# Patient Record
Sex: Female | Born: 1980 | Race: White | Hispanic: No | Marital: Married | Smoking: Current every day smoker
Health system: Southern US, Community
[De-identification: ages and names within clinical notes are randomized; demographics above are authoritative.]

## PROBLEM LIST (undated history)

## (undated) DIAGNOSIS — I341 Nonrheumatic mitral (valve) prolapse: Secondary | ICD-10-CM

## (undated) DIAGNOSIS — J45909 Unspecified asthma, uncomplicated: Secondary | ICD-10-CM

## (undated) DIAGNOSIS — F3160 Bipolar disorder, current episode mixed, unspecified: Secondary | ICD-10-CM

## (undated) DIAGNOSIS — E119 Type 2 diabetes mellitus without complications: Secondary | ICD-10-CM

---
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
## 2009-09-17 ENCOUNTER — Emergency Department: Payer: Self-pay | Admitting: Emergency Medicine

## 2010-04-29 ENCOUNTER — Encounter: Payer: Self-pay | Admitting: Obstetrics and Gynecology

## 2011-07-31 ENCOUNTER — Encounter: Payer: Self-pay | Admitting: Obstetrics and Gynecology

## 2011-08-21 ENCOUNTER — Encounter: Payer: Self-pay | Admitting: Maternal and Fetal Medicine

## 2011-10-17 ENCOUNTER — Observation Stay: Payer: Self-pay

## 2011-10-17 LAB — URINALYSIS, COMPLETE
Bacteria: NONE SEEN
Bilirubin,UR: NEGATIVE
Blood: NEGATIVE
Glucose,UR: NEGATIVE mg/dL (ref 0–75)
Leukocyte Esterase: NEGATIVE
Nitrite: NEGATIVE
Ph: 7 (ref 4.5–8.0)
RBC,UR: NONE SEEN /HPF (ref 0–5)
Squamous Epithelial: 3
WBC UR: <1 /HPF (ref 0–5)

## 2011-10-19 LAB — URINE CULTURE

## 2011-10-23 ENCOUNTER — Encounter: Payer: Self-pay | Admitting: Maternal & Fetal Medicine

## 2011-11-20 ENCOUNTER — Observation Stay: Payer: Self-pay | Admitting: Obstetrics and Gynecology

## 2011-11-20 LAB — URINALYSIS, COMPLETE
Bilirubin,UR: NEGATIVE
Blood: NEGATIVE
Ketone: NEGATIVE
Leukocyte Esterase: NEGATIVE
Ph: 7 (ref 4.5–8.0)
Protein: NEGATIVE
RBC,UR: <1 /HPF (ref 0–5)
Specific Gravity: 1.001 (ref 1.003–1.030)
WBC UR: 1 /HPF (ref 0–5)

## 2011-11-24 ENCOUNTER — Encounter: Payer: Self-pay | Admitting: Maternal & Fetal Medicine

## 2011-12-22 ENCOUNTER — Inpatient Hospital Stay: Payer: Self-pay

## 2011-12-22 HISTORY — PX: TUBAL LIGATION: SHX77

## 2011-12-22 LAB — CBC WITH DIFFERENTIAL/PLATELET
Basophil #: 0 10*3/uL (ref 0.0–0.1)
Eosinophil %: 2.3 %
HCT: 35.6 % (ref 35.0–47.0)
HGB: 12.5 g/dL (ref 12.0–16.0)
Lymphocyte #: 2.7 10*3/uL (ref 1.0–3.6)
Lymphocyte %: 19.3 %
MCH: 32.9 pg (ref 26.0–34.0)
Monocyte %: 5.8 %
Neutrophil %: 72.4 %
RDW: 13.4 % (ref 11.5–14.5)
WBC: 13.7 10*3/uL — ABNORMAL HIGH (ref 3.6–11.0)

## 2011-12-23 LAB — HEMATOCRIT: HCT: 28.4 % — ABNORMAL LOW (ref 35.0–47.0)

## 2011-12-24 LAB — PATHOLOGY REPORT

## 2014-02-16 ENCOUNTER — Ambulatory Visit: Payer: Self-pay

## 2014-02-16 ENCOUNTER — Emergency Department: Payer: Self-pay | Admitting: Emergency Medicine

## 2014-02-16 LAB — COMPREHENSIVE METABOLIC PANEL
ALBUMIN: 3.8 g/dL (ref 3.4–5.0)
ALT: 22 U/L
ANION GAP: 7 (ref 7–16)
Alkaline Phosphatase: 97 U/L
BUN: 10 mg/dL (ref 7–18)
Bilirubin,Total: 0.2 mg/dL (ref 0.2–1.0)
CALCIUM: 8.6 mg/dL (ref 8.5–10.1)
CHLORIDE: 106 mmol/L (ref 98–107)
CO2: 27 mmol/L (ref 21–32)
Creatinine: 0.74 mg/dL (ref 0.60–1.30)
EGFR (African American): >60
EGFR (Non-African Amer.): >60
Glucose: 89 mg/dL (ref 65–99)
Osmolality: 278 (ref 275–301)
POTASSIUM: 3.8 mmol/L (ref 3.5–5.1)
SGOT(AST): 19 U/L (ref 15–37)
Sodium: 140 mmol/L (ref 136–145)
Total Protein: 8 g/dL (ref 6.4–8.2)

## 2014-02-16 LAB — CBC
HCT: 40 % (ref 35.0–47.0)
HGB: 13.7 g/dL (ref 12.0–16.0)
MCH: 32.1 pg (ref 26.0–34.0)
MCHC: 34.2 g/dL (ref 32.0–36.0)
MCV: 94 fL (ref 80–100)
Platelet: 278 10*3/uL (ref 150–440)
RBC: 4.25 10*6/uL (ref 3.80–5.20)
RDW: 12.6 % (ref 11.5–14.5)
WBC: 11.8 10*3/uL — ABNORMAL HIGH (ref 3.6–11.0)

## 2014-02-16 LAB — URINALYSIS, COMPLETE
BACTERIA: NONE SEEN
BILIRUBIN, UR: NEGATIVE
BLOOD: NEGATIVE
GLUCOSE, UR: NEGATIVE mg/dL (ref 0–75)
Ketone: NEGATIVE
LEUKOCYTE ESTERASE: NEGATIVE
NITRITE: NEGATIVE
PROTEIN: NEGATIVE
Ph: 5 (ref 4.5–8.0)
Specific Gravity: 1.019 (ref 1.003–1.030)

## 2014-02-16 LAB — CK TOTAL AND CKMB (NOT AT ARMC): CK, Total: 67 U/L

## 2014-02-16 LAB — D-DIMER(ARMC): D-DIMER: 445 ng/mL

## 2014-02-16 LAB — TROPONIN I

## 2014-11-05 NOTE — Consult Note (Signed)
Referral Information:   Reason for Referral History of preterm delivery x3 (used procardia), prior child with spina bifida and hydrocephalus, prior cesareans    Referring Physician [facility]- Dr Sherilyn Banker    Prenatal Hx 34 yo G5 P1304 MWF with LMP 03/27/2011 at 15 1/7 by ultrasound with revised  EDC of 01/21/12,  4 cesarean sections, 3 preterm deliveries, 2nd child h/o spina bifida /hydrocephalus (was with another partner), h/o mild asthma, h/o bipolar disorder off medications    Past Obstetrical Hx 2002 - cesarean 34-36 weeks breech "Deonna"  female ,NICU x 3weeks for apnea and reflux helathy now 2005- c/s at [facility]  known spina bifida "Darryl" no shunt walks with braces, wears diapers, urinary reflux may need further surgery- I did not ask if on depakote at the time  2007- 38 weeks female "Perlie Gold" [place] infected incision  2012 36 weeks "McKenzie" discharged to home with her, [place] - some problems with anesthesia wearing off , no mention of ovary problems , took down scar tissue used procardia from 24 weeks to 36 weeks tried to take her off and she woudl contract , got Betamethasone for lungs   Home Medications:  Prenatal Multivitamins oral tablet: 1 tab(s) orally once a day, Active  Allergies:   Abilify: Anaphylaxis  Topamax: Rash  Vital Signs/Notes:  Nursing Vital Signs: **Vital Signs.:   17-Jan-13 09:01   Vital Signs Type Routine   Temperature Temperature (F) 98.3   Celsius 36.8   Temperature Source oral   Pulse Pulse 74   Pulse source per Dinamap   Respirations Respirations 14   Systolic BP Systolic BP 112   Diastolic BP (mmHg) Diastolic BP (mmHg) 556   Mean BP 408   BP Source Dinamap   Perinatal Consult:   LMP 27-Mar-2011    PMed Hx Rubella Immune, Hx of varicella    Past Medical History cont'd -Asthma - mild rarely uses inhaler- avoids aspirin uses alleve  -Smoker down to 2-3 cigs per day -Allergies - worse with new puppy, congestion ,  thinks flu shot made it worse -Bipolar dx age[age] , required hospitalizations, "cardiac arrest" while on lithium, depakote, topamax and abilify (now listed as "allergy")  off meds for 3 years (since with current husband), some moodiness after last delivery , husband confirms mood stable  - hypothyroidism age 72 related to psych meds? 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    Soc Hx married, tobacco, cut back on cigarettes   Review Of Systems:   Subjective Notes h/o left sided discomfort - thought it was a UTI but C&S negative - now wonders if it was the cyst Congestion    Medications/Allergies Reviewed Medications/Allergies reviewed     Additional Lab/Radiology Notes see ultrasound report - 15 weeks , 9 cm mulitloculated ov cyst   Impression/Recommendations:   Impression -IUP 15 weeks 1 9cm mulitloculated ovarian cyst - I reviewed that size and septations/loculations put it in a higher risk category for neoplasm including malignancy  2 cesarean x4 - plans repeat and BTL h/o wound breakdown, h/o scarring ,  h/o anesthesia "wearing off" 3 h/o open spina bifida - recurrence in 2-3 % range different father - decliend gen counseling, ultrasound today looks normal will reassess in 2 weeks - depakote related? (I did not ask)  4 h/o late preterm birth x3 34-36 weeks little morbidity from prematurity reviewed 6417 P, controlled with procardia in last pregnancy- t least a 50% risk of recurrence 5 h/o bipolar disorder off meds no mental health provider mood stable by report 6 asthma /allergies - mild sxs- could offer flonase  7-Desires sterilization     Recommendations 1- follow up ultrasound in 2 weeks if ovarian mass persists or enlarges,  I recommend removal prior to 22 weeks to avoid preterm labor of a viable fetus. If [facility] g[facility] w[facility] prefer I send our pregnant pts with complex adnexal masses to Dr Woodfin Ganjaraig [facility] for attempt at Laporoscopic removal - this cyst is high and to the left, should be accessible . I discussed torsion and need to go to ER for severe pain. Cyst was not present at cesarean one year ago. 2-will re-evaluate fetal spine in 2 weeks - given 15 weeks I did not discuss additional  folic acid  3 will re-assess cervical length in 2 weeks though sounds like preterm labor not cervical incompetence. Patient will read on 17 P but likely will refuse given how healthy her children have been despite being late preterm. I toldher procardia could be used if needed for contractions 4- pt declined referral to mental health 5 encouraged minimizing tobacco use   Plan:   Genetic Counseling declined    Prenatal Diagnosis Options Level II US, in 2 weeks    Ultrasound at what gestational ages 2730-32 weeks growth and assess anterior placenta    Delivery Mode Cesarean, BTL    Delivery at what gestational age [redacted] weeks     Total Time Spent with Patient 30 minutes    >50% of visit spent in couseling/coordination of care yes    Office Use Only [address]  Level 2 (30min) NEW office consult exp prob focused   Coding Description: MATERNAL CONDITIONS/HISTORY INDICATION(S).   Previous C-section.   Threatened premature labor > 22 weeks, < 37 weeks.   Tobacco use complicating pregnancy.  Electronic Signatures: Rondall AllegraLivingston, Laritza Vokes Gresham (MD)  (Signed 17-Jan-13 12:01)  Authored: Referral, Home Medications, Allergies, Vital Signs/Notes, Consult, Exam, Lab/Radiology Notes, Impression, Plan, Billing, Coding Description   Last Updated: 17-Jan-13 12:01 by Rondall AllegraLivingston, Reygan Heagle Gresham (MD)

## 2014-11-05 NOTE — Op Note (Signed)
PATIENT NAME:  Cynthia Howard, Cynthia Howard MR#:  [id number] DATE OF BIRTH:  1980-12-07  DATE OF PROCEDURE:  12/22/2011  PREOPERATIVE DIAGNOSIS: 35.3 week intrauterine pregnancy, preterm labor, prior C-section x4.   POSTOPERATIVE DIAGNOSIS: 35.3 week intrauterine pregnancy, preterm labor, prior C-section x4.   PROCEDURES: Repeat low transverse cesarean section with partial bilateral salpingectomy.   SURGEON: Cynthia Howard, M.D.   ASSISTANT: Cynthia Howard, Cynthia Howard  ESTIMATED BLOOD LOSS: 800 mL.  OPERATIVE FLUIDS: 1600 mL.   ANESTHESIA: Spinal.   COMPLICATIONS: None.   FINDINGS: Vertex female infant, 3280 grams, Apgars 8 and 9.   FINDINGS: Dense adhesions of lower uterine segment to the anterior abdominal wall, adhesions of the right tubal fimbria to the right ovary making tube difficult to locate during tubal, dense adhesions of bladder to lower uterine segment making bladder flap impossible to make.   SPECIMEN: Portion of right and left tube.   INDICATIONS: The patient is a 34 year old G5, P4 with prior history of four cesarean sections who presents with contractions every two minutes with no change in contractions after administration of terbutaline as well as short-term fetal tachycardia. Given the patient's presentation with contractions and no change after two hours of observation and pain with the contractions, the decision was made to proceed towards cesarean section for delivery given her prior history for four cesarean sections. The risks, benefits, indications, and alternatives of the procedure were explained and informed consent was obtained.   DESCRIPTION OF PROCEDURE: The patient was taken to the Operating Room with IV fluids running. She was prepped and draped in the usual sterile fashion with a leftward tilt. A midline skin incision was made and carried down to THE underlying fascia at which time the abdominal cavity was entered. The incision was extended superiorly using curved Mayo  scissors and extended inferiorly in a blunt fashion. The uterus was identified and dense adhesions were noted of the anterior uterus to the anterior abdominal wall. The bladder flap was attempted to be created, however, due to dense adhesions in this area, this was not possible. Therefore, a higher than normal uterine transverse incision was made to ensure no damage to the bladder. The placenta was also anterior and had to be entered in order to grasp the infant's head. The infant's head was grasped and delivered atraumatically through the hysterotomy incision. The mouth and nose were bulb suctioned. The anterior and posterior shoulder were delivered followed by the remainder of the body. The cord was clamped x2 and cut and the infant was handed to the awaiting nursery staff. The placenta was expressed. The uterus was exteriorized with difficulty secondary to the previously mentioned dense adhesions. The uterus was cleared of all clot and debris. The hysterotomy incision was repaired in two layers. There was constant oozing from the bluntly lysed adhesions of the anterior abdominal wall and the lower uterine segment and the area of the serosa had been sheared at the time of lysis of adhesions and this was repaired using running locked stitches of #0 Monocryl suture. The hysterotomy incision had been repaired with #0 Monocryl on a CTX needle. The left tube was grasped with a Babcock. The knuckle of tube was tied using #0 plain gut x2, and also suture tied, and the tube loop was then cut. The cut edges were made hemostatic. This was repeated on the patient's right tube; however, this was done with great difficulty secondary to adhesions and difficulty identifying the left tube by following it out to the fimbriated end.  The patient was awake during the procedure and is aware that because of the difficulty of identifying the right tube that until the pathology report comes back it will not be known whether or not her  right tube was successfully tied and she understands that an alternative form of birth control may be needed. The uterus was returned to the abdomen. Arista as well as Surgicel hemostatic agents were used for the constant oozing. The fascia and peritoneum was closed in a Smead-Jones fashion using #1 double-stranded PDS. The subcutaneous tissue was        closed with 2-0 Vicryl and the skin was closed with staples. The patient tolerated the procedure well. Sponge, lap, needle and instrument counts were correct x2, and the patient was taken to the recovery room in stable condition.  ____________________________ Cynthia PrimesLashawn A. Patton Howard, Cynthia Howard law:slb D: 12/22/2011 22:39:28 ET T: 12/23/2011 09:45:34 ET JOB#: [id number]  cc: Cynthia MelterLashawn A. Patton Howard, Cynthia Howard, <Dictator> Cynthia ContesLASHAWN A WEAVER Howard Cynthia Howard ELECTRONICALLY SIGNED 12/25/2011 7:43

## 2014-11-21 NOTE — H&P (Signed)
L&D Evaluation:  History:   HPI 34 yo G5P4004 whose EDC = 01/21/12 (US at 15 weeks)  Pt followed at [facility] for this pregnancy.  Patient presents with uterine contractions and leakage of fluid x 2 days.  She states that she has been having contractions for the past several week, but today is worse.  She notes positive fetal movement, denies VB.  Pt's 4 previous pregnancies terminated via LTCS (the last described severe adhesions between anterior uterus and anterior abdominal wall)  Pt jhad PTL with all her pregnancies, however, her smallest infant weighed 5#9.  Patient presented too late for 17-OHP for this pregnancy per [facility].  Patient's 2nd pregnancy complicated by infant with spina bifida.  Pt also desires PPTL (signed papers 4 5 13).  B+, RPR NR, RI, HBsAg neg, declined AFP. GBS unk.    Presents with contractions, leaking fluid    Patient's Medical History BiPolar for the last 5 years, mild asthma    Patient's Surgical History Previous C-Section  times 4    Medications Pre Natal Vitamins    Allergies Abilify, Topamax    Social History tobacco    Family History second child had spina bifida   ROS:   ROS All systems were reviewed.  HEENT, CNS, GI, GU, Respiratory, CV, Renal and Musculoskeletal systems were found to be normal., unless noted in HPI   Exam:   Vital Signs stable    Urine Protein negative dipstick    General no apparent distress    Mental Status clear    Chest clear    Heart normal sinus rhythm    Abdomen gravid, non-tender    Estimated Fetal Weight Average for gestational age    Back no CVAT    Edema no edema    Pelvic cervix closed and thick    Mebranes Intact    FHT normal rate with no decels    FHT Description 135/mod var/+accels/no decels    Ucx irregular, 0-1 q 10 minutes.    Other UA: spec grav = 1.001, pH = 7.0, nitr = neg, LE=neg, bact=trace, Epi's=1 Nitrazine=neg, Fern=neg, pool=neg   Impression:   Impression reactive NST, abdominal  pain   Plan:   Plan UA, EFM/NST, monitor contractions and for cervical change, False  Labor    Comments - no evidence of PPROM - Labor: no evidence of labor    Follow Up Appointment already scheduled. 4 days, [facility] planning   Electronic Signatures: Conard NovakJackson, Thurmond Hildebran D (MD)  (Signed 09-May-13 22:52)  Authored: L&D Evaluation   Last Updated: 09-May-13 22:52 by Conard NovakJackson, Skylinn Vialpando D (MD)

## 2014-11-21 NOTE — H&P (Signed)
L&D Evaluation:  History Expanded:   HPI 34 yo G5P4004 whose EDC = 01/21/12 (US at 15 weeks)  Pt followed at [facility] for this pregnancy.  Pt referred from [facility] for uterine contractions.  Pt's 4 previous pregnancies terminated via LTCS (the last described severe adhesions between anterior uterus and anterior abdominal wall)  Pt jhad PTL with all her pregnancies, however, her smallest infant weighed 5#9.  Pt also desires PPTL (signed papers 4 5 13)    Blood Type B positive    Maternal HIV Negative    Maternal Syphilis Ab Nonreactive    Rubella Results immune    Presents with contractions    Patient's Medical History BiPolar for the last 5 years, mild asthma    Patient's Surgical History Previous C-Section  times 4    Medications Pre Natal Vitamins    Allergies Abilify, Topamax    Social History tobacco    Family History second child had spina bifida   Exam:   Vital Signs stable    General no apparent distress    Mental Status clear    Chest clear    Heart normal sinus rhythm    Abdomen gravid, non-tender    Estimated Fetal Weight Average for gestational age    Pelvic cervix closed and thick    Mebranes Intact    FHT normal rate with no decels    Ucx irregular   Plan:   Plan False  Labor    Comments will hydrate, allow to gohome if contractions cease Pt also advised [facility].  I have advised this patient that we most likely will deliver her through a midline incision   Electronic Signatures: Towana Badgerosenow, Philip J (MD)  (Signed 05-Apr-13 16:07)  Authored: L&D Evaluation   Last Updated: 05-Apr-13 16:07 by Towana Badgerosenow, Philip J (MD)

## 2019-01-30 ENCOUNTER — Encounter: Payer: Self-pay | Admitting: Radiology

## 2019-01-30 ENCOUNTER — Emergency Department: Payer: Medicaid Other

## 2019-01-30 ENCOUNTER — Other Ambulatory Visit: Payer: Self-pay

## 2019-01-30 ENCOUNTER — Observation Stay
Admission: EM | Admit: 2019-01-30 | Discharge: 2019-01-31 | Disposition: A | Discharge status: Home / Self Care | Payer: Medicaid Other | Attending: Internal Medicine | Admitting: Internal Medicine

## 2019-01-30 DIAGNOSIS — R600 Localized edema: Secondary | ICD-10-CM

## 2019-01-30 DIAGNOSIS — R531 Weakness: Secondary | ICD-10-CM | POA: Diagnosis present

## 2019-01-30 DIAGNOSIS — F17210 Nicotine dependence, cigarettes, uncomplicated: Secondary | ICD-10-CM | POA: Insufficient documentation

## 2019-01-30 DIAGNOSIS — Z794 Long term (current) use of insulin: Secondary | ICD-10-CM | POA: Diagnosis not present

## 2019-01-30 DIAGNOSIS — F319 Bipolar disorder, unspecified: Secondary | ICD-10-CM | POA: Diagnosis not present

## 2019-01-30 DIAGNOSIS — K5730 Diverticulosis of large intestine without perforation or abscess without bleeding: Secondary | ICD-10-CM | POA: Insufficient documentation

## 2019-01-30 DIAGNOSIS — Z1159 Encounter for screening for other viral diseases: Secondary | ICD-10-CM | POA: Insufficient documentation

## 2019-01-30 DIAGNOSIS — I639 Cerebral infarction, unspecified: Principal | ICD-10-CM

## 2019-01-30 DIAGNOSIS — Z79899 Other long term (current) drug therapy: Secondary | ICD-10-CM | POA: Insufficient documentation

## 2019-01-30 DIAGNOSIS — R2681 Unsteadiness on feet: Secondary | ICD-10-CM | POA: Diagnosis not present

## 2019-01-30 DIAGNOSIS — I341 Nonrheumatic mitral (valve) prolapse: Secondary | ICD-10-CM | POA: Diagnosis not present

## 2019-01-30 DIAGNOSIS — E119 Type 2 diabetes mellitus without complications: Secondary | ICD-10-CM | POA: Diagnosis not present

## 2019-01-30 DIAGNOSIS — J45909 Unspecified asthma, uncomplicated: Secondary | ICD-10-CM | POA: Insufficient documentation

## 2019-01-30 DIAGNOSIS — R2990 Unspecified symptoms and signs involving the nervous system: Secondary | ICD-10-CM | POA: Diagnosis present

## 2019-01-30 DIAGNOSIS — R200 Anesthesia of skin: Secondary | ICD-10-CM | POA: Diagnosis present

## 2019-01-30 HISTORY — DX: Unspecified asthma, uncomplicated: J45.909

## 2019-01-30 HISTORY — DX: Type 2 diabetes mellitus without complications: E11.9

## 2019-01-30 LAB — COMPREHENSIVE METABOLIC PANEL
ALT: 21 U/L (ref 0–44)
AST: 15 U/L (ref 15–41)
Albumin: 4 g/dL (ref 3.5–5.0)
Alkaline Phosphatase: 81 U/L (ref 38–126)
Anion gap: 8 (ref 5–15)
BUN: 12 mg/dL (ref 6–20)
CO2: 25 mmol/L (ref 22–32)
Calcium: 8.8 mg/dL — ABNORMAL LOW (ref 8.9–10.3)
Chloride: 105 mmol/L (ref 98–111)
Creatinine, Ser: 0.74 mg/dL (ref 0.44–1.00)
GFR calc Af Amer: >60 mL/min (ref >60)
GFR calc non Af Amer: >60 mL/min (ref >60)
Glucose, Bld: 113 mg/dL — ABNORMAL HIGH (ref 70–99)
Potassium: 3.7 mmol/L (ref 3.5–5.1)
Sodium: 138 mmol/L (ref 135–145)
Total Bilirubin: 0.2 mg/dL — ABNORMAL LOW (ref 0.3–1.2)
Total Protein: 7 g/dL (ref 6.5–8.1)

## 2019-01-30 LAB — GLUCOSE, CAPILLARY: Glucose-Capillary: 111 mg/dL — ABNORMAL HIGH (ref 70–99)

## 2019-01-30 LAB — APTT: aPTT: 29 seconds (ref 24–36)

## 2019-01-30 LAB — CBC
HCT: 37.6 % (ref 36.0–46.0)
Hemoglobin: 12.9 g/dL (ref 12.0–15.0)
MCH: 32 pg (ref 26.0–34.0)
MCHC: 34.3 g/dL (ref 30.0–36.0)
MCV: 93.3 fL (ref 80.0–100.0)
Platelets: 268 10*3/uL (ref 150–400)
RBC: 4.03 MIL/uL (ref 3.87–5.11)
RDW: 11.9 % (ref 11.5–15.5)
WBC: 11.7 10*3/uL — ABNORMAL HIGH (ref 4.0–10.5)
nRBC: 0 % (ref 0.0–0.2)

## 2019-01-30 LAB — DIFFERENTIAL
Abs Immature Granulocytes: 0.04 10*3/uL (ref 0.00–0.07)
Basophils Absolute: 0.1 10*3/uL (ref 0.0–0.1)
Basophils Relative: 1 %
Eosinophils Absolute: 0.5 10*3/uL (ref 0.0–0.5)
Eosinophils Relative: 4 %
Immature Granulocytes: 0 %
Lymphocytes Relative: 26 %
Lymphs Abs: 3 10*3/uL (ref 0.7–4.0)
Monocytes Absolute: 0.6 10*3/uL (ref 0.1–1.0)
Monocytes Relative: 5 %
Neutro Abs: 7.5 10*3/uL (ref 1.7–7.7)
Neutrophils Relative %: 64 %

## 2019-01-30 LAB — SARS CORONAVIRUS 2 BY RT PCR (HOSPITAL ORDER, PERFORMED IN ~~LOC~~ HOSPITAL LAB): SARS Coronavirus 2: NEGATIVE

## 2019-01-30 LAB — PROTIME-INR
INR: 1 (ref 0.8–1.2)
Prothrombin Time: 12.7 seconds (ref 11.4–15.2)

## 2019-01-30 LAB — POCT PREGNANCY, URINE: Preg Test, Ur: NEGATIVE

## 2019-01-30 MED ORDER — SODIUM CHLORIDE 0.9 % IV SOLN
50.0000 mL | Freq: Once | INTRAVENOUS | Status: AC
  Administered 2019-01-30: 50 mL via INTRAVENOUS

## 2019-01-30 MED ORDER — ALTEPLASE 100 MG IV SOLR
INTRAVENOUS | Status: AC
  Administered 2019-01-30: 20:00:00 90 mg via INTRAVENOUS
  Filled 2019-01-30: qty 100

## 2019-01-30 MED ORDER — LABETALOL HCL 5 MG/ML IV SOLN: 20.0000 mg | Freq: Once | INTRAVENOUS | Status: DC

## 2019-01-30 MED ORDER — ASPIRIN EC 81 MG PO TBEC: 81.0000 mg | DELAYED_RELEASE_TABLET | Freq: Every day | ORAL | Status: DC

## 2019-01-30 MED ORDER — IOHEXOL 350 MG/ML SOLN
125.0000 mL | Freq: Once | INTRAVENOUS | Status: AC | PRN
  Administered 2019-01-30: 125 mL via INTRAVENOUS

## 2019-01-30 MED ORDER — SODIUM CHLORIDE 0.9% FLUSH: 3.0000 mL | Freq: Once | INTRAVENOUS | Status: DC

## 2019-01-30 MED ORDER — CLEVIDIPINE BUTYRATE 0.5 MG/ML IV EMUL: 0.0000 mg/h | INTRAVENOUS | Status: DC

## 2019-01-30 MED ORDER — ALTEPLASE (STROKE) FULL DOSE INFUSION
90.0000 mg | Freq: Once | INTRAVENOUS | Status: AC
  Administered 2019-01-30: 90 mg via INTRAVENOUS
  Filled 2019-01-30 (×2): qty 100

## 2019-01-30 NOTE — ED Notes (Signed)
Pt transported to CT for CTA at this time.

## 2019-01-30 NOTE — Consult Note (Signed)
[facility] Tele[facility]urology Consult Services  Stat Consult  Date of Service:   01/30/2019 22:59:59  Impression:     .  transient weakness  Comments/Sign-Out: I think her care up to this point has been excellent and appropriate. However she does have some atypical features such as she gets significant pain in her left forearm when she gets the weakness, a MRI of the C-spine could be added to her workup to make sure she does not have a compressive myeloradiculopathy although she does have atypical features for that as well. I do not think we need a stat repeat CT of the head at this moment because her symptoms have completely resolved which is inconsistent with a cerebral hemorrhage. If she is having stuttering cerebrovascular ischemia, there is no more anti-thrombotic therapy that would be safe for her until 24 hours after she is received the TPA so she was not started on any new agents at this time. She will need continued neurology follow-up while in house. a nonorganic syndrome is not completely excluded either this point.  Metrics: [facility] Notification Time: 01/30/2019 22:59:17 Stamp Time: 01/30/2019 22:59:59 Video Start Time: 01/30/2019 23:11:32  Our recommendations are outlined below.   Disposition: Neurology Follow Up Recommended  ----------------------------------------------------------------------------------------------------  Chief Complaint: transient pain annd weakness  History of Present Illness: Patient is a 38 year old Female.  Today  she developed pain in her left arm and then had significant weakness of the left upper and left lower extremity she also had some chest pain. She came to the emergency department and had a CTA of the chest to rule out aortic dissection which was normal. She had a CT of the head and neck that was normal and she was given TPA. Shortly after receiving the TPA her symptoms completely resolved. At 2300 tonight she developed pain in  her left forearm again and then she felt like her left arm and left leg were weak again, this lasted for about 11 minutes. At this time she is completely back to normal. She has no history of cervical disease.  Examination: BP(141/74), 1A: Level of Consciousness - Alert; keenly responsive + 0 1B: Ask Month and Age - Both Questions Right + 0 1C: Blink Eyes & Squeeze Hands - Performs Both Tasks + 0 2: Test Horizontal Extraocular Movements - Normal + 0 3: Test Visual Fields - No Visual Loss + 0 4: Test Facial Palsy (Use Grimace if Obtunded) - Normal symmetry + 0 5A: Test Left Arm Motor Drift - No Drift for 10 Seconds + 0 5B: Test Right Arm Motor Drift - No Drift for 10 Seconds + 0 6A: Test Left Leg Motor Drift - No Drift for 5 Seconds + 0 6B: Test Right Leg Motor Drift - No Drift for 5 Seconds + 0 7: Test Limb Ataxia (FNF/Heel-Shin) - No Ataxia + 0 8: Test Sensation - Normal; No sensory loss + 0 9: Test Language/Aphasia - Normal; No aphasia + 0 10: Test Dysarthria - Normal + 0 11: Test Extinction/Inattention - No abnormality + 0  NIHSS Score: 0   Due to the immediate potential for life-threatening deterioration due to underlying acute neurologic illness, I spent 35 minutes providing critical care. This time includes time for face to face visit via telemedicine, review of medical records, imaging studies and discussion of findings with providers, the patient and/or family.   Dr Janean Sark   [facility] [phone]   Case [id number]

## 2019-01-30 NOTE — ED Notes (Signed)
Pt in ct with RN assist.

## 2019-01-30 NOTE — ED Notes (Addendum)
2313 On call neurologist on tele. Advised no additional blood thinners for 24 hours.

## 2019-01-30 NOTE — ED Provider Notes (Signed)
[facility] Emergency Department Provider Note  ____________________________________________   First MD Initiated Contact with Patient 01/30/19 1859     (approximate)  I have reviewed the triage vital signs and the nursing notes.   HISTORY  Chief Complaint Code Stroke    HPI Cynthia Howard is a 38 y.o. female with bipolar who presents for strokelike symptoms.  Per family patient was normal at dinnertime and then 1 to 2 hours ago developed difficulty with speech, facial asymmetry, numbness on the left side of her body.  Patient denies previously having stroke.  Denies blood thinners.  Patient denies blood thinner issues  Onset 1 to 2 hours ago, constant, nothing makes it better, nothing makes it worse.    Medical: Bipolar Surgical: None Social: Denies daily alcohol or drug use.  Allergies Abilify [aripiprazole] and Topamax [topiramate]  Family History  Problem Relation Age of Onset   Diabetes Father      Review of Systems Constitutional: No fever/chills Eyes: No visual changes. ENT: No sore throat.  Positive speech changes Cardiovascular: Denies chest pain. Respiratory: Denies shortness of breath. Gastrointestinal: No abdominal pain.  No nausea, no vomiting.  No diarrhea.  No constipation. Genitourinary: Negative for dysuria. Musculoskeletal: Negative for back pain. Skin: Negative for rash. Neurological: Positive sensation changes.  Positive difficulty was speech. All other ROS negative ____________________________________________   PHYSICAL EXAM:  VITAL SIGNS:   Constitutional: Alert and oriented. Well appearing and in no acute distress. Eyes: Conjunctivae are normal. EOMI. Head: Atraumatic. Nose: No congestion/rhinnorhea. Mouth/Throat: Mucous membranes are moist.   Neck: No stridor. Trachea Midline. FROM Cardiovascular: Normal rate, regular rhythm. Grossly normal heart sounds.  Good peripheral  circulation. Respiratory: Normal respiratory effort.  No retractions. Lungs CTAB. Gastrointestinal: Soft and nontender. No distention. No abdominal bruits.  Musculoskeletal: No lower extremity tenderness nor edema.  No joint effusions. Neurologic: Patient seems to have asymmetric face, difficulty speaking, reports decreased sensation on the left side of her body. NIHSS 8 Skin:  Skin is warm, dry and intact. No rash noted. Psychiatric: Mood and affect are normal. Speech and behavior are normal. GU: Deferred   ____________________________________________   LABS (all labs ordered are listed, but only abnormal results are displayed)  Labs Reviewed  PROTIME-INR  APTT  CBC  DIFFERENTIAL  COMPREHENSIVE METABOLIC PANEL  CBG MONITORING, ED  I-STAT CREATININE, ED  POC URINE PREG, ED   ____________________________________________   ED ECG REPORT I, Concha SeMary E Romond Pipkins, the attending physician, personally viewed and interpreted this ECG.  EKG normal sinus rate of 93, no ST elevation, no T wave inversion however does have multiple PVCs. ____________________________________________  RADIOLOGY Vela ProseI, Shontel Santee E Delwin Raczkowski, personally viewed and evaluated these images (plain radiographs) as part of my medical decision making, as well as reviewing the written report by the radiologist.    Official radiology report(s): Ct Code Stroke Cta Head W/wo Contrast  Result Date: 01/30/2019 CLINICAL DATA:  Left-sided numbness and weakness.  Slurred speech. EXAM: CT ANGIOGRAPHY HEAD AND NECK TECHNIQUE: Multidetector CT imaging of the head and neck was performed using the standard protocol during bolus administration of intravenous contrast. Multiplanar CT image reconstructions and MIPs were obtained to evaluate the vascular anatomy. Carotid stenosis measurements (when applicable) are obtained utilizing NASCET criteria, using the distal internal carotid diameter as the denominator. CONTRAST:  125mL OMNIPAQUE IOHEXOL 350  MG/ML SOLN COMPARISON:  None. FINDINGS: CTA NECK FINDINGS Aortic arch: Standard 3 vessel aortic arch with widely patent arch vessel origins. Right carotid  system: Patent and smooth without evidence of stenosis or dissection. Left carotid system: Patent and smooth without evidence of stenosis or dissection. Vertebral arteries: Patent and codominant without evidence of stenosis or dissection. Skeleton: Mild cervical spondylosis. Other neck: No evidence of cervical lymphadenopathy or mass. Upper chest: Reported separately. Review of the MIP images confirms the above findings CTA HEAD FINDINGS Anterior circulation: The internal carotid arteries are widely patent from skull base to carotid termini. ACAs and MCAs are patent without evidence of proximal branch occlusion or flow limiting M1 stenosis. The left A1 segment is hypoplastic. Diffusely irregular appearance of the small and medium-sized vessels is felt to be artifactual related to image noise and mildly suboptimal arterial opacification, however this does preclude assessment for branch vessel stenosis. No aneurysm is identified. Posterior circulation: The intracranial vertebral arteries are widely patent to the basilar. Patent right PICA, left AICA, and bilateral SCA origins are visualized. There is a fetal origin of the left PCA. Both PCAs are patent without evidence of significant proximal stenosis. No aneurysm is identified. Venous sinuses: Patent. Anatomic variants: Fetal left PCA. Review of the MIP images confirms the above findings IMPRESSION: 1. Widely patent cervical carotid and vertebral arteries. 2. No intracranial large vessel occlusion or flow limiting proximal stenosis. Limited assessment of the small to medium-sized vessels. Electronically Signed   By: Logan Bores M.D.   On: 01/30/2019 20:18   Ct Code Stroke Cta Neck W/wo Contrast  Result Date: 01/30/2019 CLINICAL DATA:  Left-sided numbness and weakness.  Slurred speech. EXAM: CT ANGIOGRAPHY HEAD  AND NECK TECHNIQUE: Multidetector CT imaging of the head and neck was performed using the standard protocol during bolus administration of intravenous contrast. Multiplanar CT image reconstructions and MIPs were obtained to evaluate the vascular anatomy. Carotid stenosis measurements (when applicable) are obtained utilizing NASCET criteria, using the distal internal carotid diameter as the denominator. CONTRAST:  122mL OMNIPAQUE IOHEXOL 350 MG/ML SOLN COMPARISON:  None. FINDINGS: CTA NECK FINDINGS Aortic arch: Standard 3 vessel aortic arch with widely patent arch vessel origins. Right carotid system: Patent and smooth without evidence of stenosis or dissection. Left carotid system: Patent and smooth without evidence of stenosis or dissection. Vertebral arteries: Patent and codominant without evidence of stenosis or dissection. Skeleton: Mild cervical spondylosis. Other neck: No evidence of cervical lymphadenopathy or mass. Upper chest: Reported separately. Review of the MIP images confirms the above findings CTA HEAD FINDINGS Anterior circulation: The internal carotid arteries are widely patent from skull base to carotid termini. ACAs and MCAs are patent without evidence of proximal branch occlusion or flow limiting M1 stenosis. The left A1 segment is hypoplastic. Diffusely irregular appearance of the small and medium-sized vessels is felt to be artifactual related to image noise and mildly suboptimal arterial opacification, however this does preclude assessment for branch vessel stenosis. No aneurysm is identified. Posterior circulation: The intracranial vertebral arteries are widely patent to the basilar. Patent right PICA, left AICA, and bilateral SCA origins are visualized. There is a fetal origin of the left PCA. Both PCAs are patent without evidence of significant proximal stenosis. No aneurysm is identified. Venous sinuses: Patent. Anatomic variants: Fetal left PCA. Review of the MIP images confirms the above  findings IMPRESSION: 1. Widely patent cervical carotid and vertebral arteries. 2. No intracranial large vessel occlusion or flow limiting proximal stenosis. Limited assessment of the small to medium-sized vessels. Electronically Signed   By: Logan Bores M.D.   On: 01/30/2019 20:18   Ct Angio Chest/abd/pel For  Dissection W And/or Wo Contrast  Result Date: 01/30/2019 CLINICAL DATA:  Left-sided weakness and numbness today. Unstable gait. EXAM: CT ANGIOGRAPHY CHEST, ABDOMEN AND PELVIS TECHNIQUE: Multidetector CT imaging through the chest, abdomen and pelvis was performed using the standard protocol during bolus administration of intravenous contrast. Multiplanar reconstructed images and MIPs were obtained and reviewed to evaluate the vascular anatomy. Pre contrast images were also obtained through the chest. CONTRAST:  125mL OMNIPAQUE IOHEXOL 350 MG/ML SOLN COMPARISON:  None. FINDINGS: CTA CHEST FINDINGS Cardiovascular: Heart is normal size. Thoracic aorta is normal in caliber without dissection or aneurysm. Remaining vascular structures are unremarkable. Mediastinum/Nodes: No mediastinal or hilar adenopathy. Remaining mediastinal structures are normal. Lungs/Pleura: Lungs are well inflated without focal airspace consolidation or effusion. Airways are normal. Musculoskeletal: Unremarkable. Review of the MIP images confirms the above findings. CTA ABDOMEN AND PELVIS FINDINGS VASCULAR Aorta: Normal caliber aorta without aneurysm, dissection, vasculitis or significant stenosis. Celiac: Patent without evidence of aneurysm, dissection, vasculitis or significant stenosis. SMA: Patent without evidence of aneurysm, dissection, vasculitis or significant stenosis. Renals: Both renal arteries are patent without evidence of aneurysm, dissection, vasculitis, fibromuscular dysplasia or significant stenosis. IMA: Patent without evidence of aneurysm, dissection, vasculitis or significant stenosis. Inflow: Patent without evidence  of aneurysm, dissection, vasculitis or significant stenosis. Veins: No obvious venous abnormality within the limitations of this arterial phase study. Review of the MIP images confirms the above findings. NON-VASCULAR Hepatobiliary: Gallbladder is contracted. Liver and biliary tree are normal. Pancreas: Normal. Spleen: Normal. Adrenals/Urinary Tract: Adrenal glands are normal. Kidneys are normal in size without hydronephrosis or nephrolithiasis. Ureters and bladder are normal. Stomach/Bowel: Stomach and small bowel are normal. Appendix is normal. Mild diverticulosis of the colon which is otherwise normal. Lymphatic: No adenopathy. Reproductive: Uterus is normal. Ovaries are high in position but otherwise unremarkable. Other: No free fluid or focal inflammatory change. Musculoskeletal: Unremarkable. Review of the MIP images confirms the above findings. IMPRESSION: Normal thoracoabdominal aorta without aneurysm or dissection. No acute findings in the chest, abdomen or pelvis. Minimal diverticulosis of the colon. Electronically Signed   By: Elberta Fortisaniel  Boyle M.D.   On: 01/30/2019 19:56   Ct Head Code Stroke Wo Contrast  Result Date: 01/30/2019 CLINICAL DATA:  Code stroke.  Weakness and confusion EXAM: CT HEAD WITHOUT CONTRAST TECHNIQUE: Contiguous axial images were obtained from the base of the skull through the vertex without intravenous contrast. COMPARISON:  02/18/2014 FINDINGS: Brain: There is no mass, hemorrhage or extra-axial collection. The size and configuration of the ventricles and extra-axial CSF spaces are normal. The brain parenchyma is normal, without evidence of acute or chronic infarction. Vascular: No abnormal hyperdensity of the major intracranial arteries or dural venous sinuses. No intracranial atherosclerosis. Skull: The visualized skull base, calvarium and extracranial soft tissues are normal. Sinuses/Orbits: No fluid levels or advanced mucosal thickening of the visualized paranasal sinuses. No  mastoid or middle ear effusion. The orbits are normal. ASPECTS [facility]([place] Stroke Program Early CT Score) - Ganglionic level infarction (caudate, lentiform nuclei, internal capsule, insula, M1-M3 cortex): 7 - Supraganglionic infarction (M4-M6 cortex): 3 Total score (0-10 with 10 being normal): 10 IMPRESSION: 1. Normal head CT. 2. ASPECTS is 10. These results were called by telephone at the time of interpretation on 01/30/2019 at 7:16 pm to Dr. Artis DelayMARY Yobani Schertzer , who verbally acknowledged these results. Electronically Signed   By: Deatra RobinsonKevin  Herman M.D.   On: 01/30/2019 19:16    ____________________________________________   PROCEDURES  Procedure(s) performed (including Critical Care):  .Critical Care  Performed by: Concha Se, MD Authorized by: Concha Se, MD   Critical care provider statement:    Critical care time (minutes):  45   Critical care was necessary to treat or prevent imminent or life-threatening deterioration of the following conditions: stroke code getting TPA    Critical care was time spent personally by me on the following activities:  Discussions with consultants, evaluation of patient's response to treatment, examination of patient, ordering and performing treatments and interventions, ordering and review of laboratory studies, ordering and review of radiographic studies, pulse oximetry, re-evaluation of patient's condition, obtaining history from patient or surrogate and review of old charts     ____________________________________________   INITIAL IMPRESSION / ASSESSMENT AND PLAN / ED COURSE  Cynthia Howard was evaluated in Emergency Department on 01/30/2019 for the symptoms described in the history of present illness. She was evaluated in the context of the global COVID-19 pandemic, which necessitated consideration that the patient might be at risk for infection with the SARS-CoV-2 virus that causes COVID-19. Institutional protocols and algorithms that pertain to the  evaluation of patients at risk for COVID-19 are in a state of rapid change based on information released by regulatory bodies including the CDC and federal and state organizations. These policies and algorithms were followed during the patient's care in the ED.    This is most concerning for stroke.  Patient get a CT head evaluate for intracranial hemorrhage/tumor that would contraindicate TPA.  Neurology consulted as a stroke code.  If CT is negative consider TPA for concern for ischemic stroke.  Blood pressures are within TPA range.  Patient denies any infectious symptoms.   CT head was negative.  Discussed with neurology would like to proceed with TPA however patient does endorse severe chest pain.  Patient endorses having chest pain.  TPA was delayed in order to get CT dissection protocol given patient is having PVCs on the monitor as well as endorsing severe chest pain.  Will get CTA as well.  7:45 PM patient returned from CT scanner.  Waiting on imaging results.  8:25 PM d/w Dr. Otelia Limes neurology from [facility] and they said normal we keep these in house. He is going to make a phone call.   8:31 PM rediscussed with the neurologist from [facility].  Given the stroke scale is improving from 8-3 he recommend the patient stay at our hospital.  Usually people only get transferred if they are needing LVO or if they have a very severe stroke.  Called to the room due to patient having some decreased strength of left-sided headache.  11:02 PM reevaluate patient.  Patient has resolutions of symptoms within minutes.  I question functional component given pain pt is having with symptoms but MRI pending.   11:34 PM d/w tele neurology, [facility]. They will add on the cervical MRI given pain component but agree this is not typical presentation of stroke with their being pain with the symptoms.   Admit to ICU after discussion with Dr. Marylene Land.   ____________________________________________   FINAL  CLINICAL IMPRESSION(S) / ED DIAGNOSES   Final diagnoses:  Acute left-sided weakness      MEDICATIONS GIVEN DURING THIS VISIT:  Medications  sodium chloride flush (NS) 0.9 % injection 3 mL (3 mLs Intravenous Not Given 01/30/19 1900)  aspirin EC tablet 81 mg (has no administration in time range)  alteplase (ACTIVASE) 1 mg/mL infusion 90 mg (0 mg Intravenous Stopped 01/30/19 2120)    Followed by  0.9 %  sodium chloride infusion (0 mLs Intravenous Stopped 01/30/19 2121)  iohexol (OMNIPAQUE) 350 MG/ML injection 125 mL (125 mLs Intravenous Contrast Given 01/30/19 1927)     ED Discharge Orders    None       Note:  This document was prepared using Dragon voice recognition software and may include unintentional dictation errors.   Concha SeFunke, Travelle Mcclimans E, MD 01/30/19 2350

## 2019-01-30 NOTE — ED Notes (Signed)
Pharmacy notified of dissection scan causing delay in TPA administration.

## 2019-01-30 NOTE — Consult Note (Addendum)
Addendum: No new or acute large vessel occlusion on head and neck CTA, not a candidate for mechanical thrombectomy.   Plan discussed with ER provider.   //////////    [facility] Tele[facility]urology Consult Services   Date of Service:   01/30/2019 18:59:24  Impression:     .  Right Hemispheric Infarct  Comments/Sign-Out: Patient with history of bipolar, asthma, mitral valve prolapse, heavy smoker, Diabetes Mellitus. Presents with severe left sided weakness. Head CT: No acute Intracranial abnormality. NIHSS 8 Symptoms/presentation symptoms consistent with right hemispheric Acute Ischemic Stroke. Risks and benefits of IV tPA discussed with patient and/or family and after obtaining answers to contraindication questions and verbal consent, and after confirming there was no aortic dissection of chest CTA, IV tPA was administered. Symptoms suggestive of Large Vessel Occlusion therefore CTA head and neck was ordered. Also patient started having chest pain in ER with PVCs on monitoring, will obtain chest CTA to rule out aortic dissection, radiology stated that due to timing of contrast CTP brain cannot be done.  Mechanism of Stroke: Possible Thromboembolic Possible Cardioembolic  Metrics: Last Known Well: 01/30/2019 17:00:00 [facility] Notification Time: 01/30/2019 18:59:24 Arrival Time: 01/30/2019 18:50:00 Stamp Time: 01/30/2019 18:59:24 Time First Login Attempt: 01/30/2019 19:04:58 Video Start Time: 01/30/2019 19:04:58  Symptoms: left sided weakness NIHSS Start Assessment Time: 01/30/2019 19:07:29 tPA Verbal Order Time: 01/30/2019 19:10:43 Patient is a candidate for tPA. tPA CPOE Order Time: 01/30/2019 19:12:38 Needle Time: 01/30/2019 20:04:26 Weight Noted by Staff: 108.7 kg Video End Time: 01/30/2019 20:06:49 Reason for tPA Delay: Delays related to Imaging  CT head showed no acute hemorrhage or acute core infarct. CT head was reviewed.  Clinical  Presentation is Suggestive of Large Vessel Occlusive Disease, Recommendations are as Follows  CTA Head and Neck.   Verbal Consent to tPA: I have explained to the Patient the nature of the patient's condition, the use of tPA fibrinolytic agent, and the benefits to be reasonably expected compared with alternative approaches. I have discussed the likelihood of major risks or complications of this procedure including (if applicable) but not limited to loss of limb function, brain damage, paralysis, hemorrhage, infection, complications from transfusion of blood components, drug reactions, blood clots and loss of life. I have also indicated that with any procedure there is always the possibility of an unexpected complication. All questions were answered and Patient express understanding of the treatment plan and consent to the treatment.  Our recommendations are outlined below.  Recommendations: IV tPA recommended.  tPA bolus given Without Complication.   IV tPA Total Dose - 90.0 mg IV tPA Bolus Dose - 9.0 mg IV tPA Infusion Dose - 81.0 mg  Routine post tPA monitoring including neuro checks and blood pressure control during/after treatment Monitor blood pressure Check blood pressure and NIHSS every 15 min for 2 h, then every 30 min for 6 h, and finally every hour for 16 h.  Manage Blood Pressure per post tPA protocol.      .  Admission to ICU     .  CT brain 24 hours post tPA     .  NPO until swallowing screen performed and passed     .  No antiplatelet agents or anticoagulants (including heparin for DVT prophylaxis) in first 24 hours     .  No Foley catheter, nasogastric tube, arterial catheter or central venous catheter for 24 hr, unless absolutely necessary     .  Telemetry     .  Bedside swallow evaluation     .  HOB less than 30 degrees     .  Euglycemia     .  Avoid hyperthermia, PRN acetaminophen     .  DVT prophylaxis     .  Inpatient Neurology Consultation     .  Stroke  evaluation as per inpatient neurology recommendations  Discussed with ED physician    ------------------------------------------------------------------------------  History of Present Illness: Patient is a 38 year old Female.  Patient was brought by private transportation with symptoms of left sided weakness  Patient with history of bipolar, asthma, mitral valve prolapse, heavy smoker.Diabetes Mellitus, last known well per patient: 17:00 developed severe left sided weakness. Anticoagulation or Antiplatelet use: no Premorbid Level of function: Independent, Normal cognition and gait   Examination: BP(143/83), Blood Glucose(113) 1A: Level of Consciousness - Alert; keenly responsive + 0 1B: Ask Month and Age - Both Questions Right + 0 1C: Blink Eyes & Squeeze Hands - Performs Both Tasks + 0 2: Test Horizontal Extraocular Movements - Normal + 0 3: Test Visual Fields - No Visual Loss + 0 4: Test Facial Palsy (Use Grimace if Obtunded) - Normal symmetry + 0 5A: Test Left Arm Motor Drift - No Effort Against Gravity + 3 5B: Test Right Arm Motor Drift - No Drift for 10 Seconds + 0 6A: Test Left Leg Motor Drift - No Effort Against Gravity + 3 6B: Test Right Leg Motor Drift - No Drift for 5 Seconds + 0 7: Test Limb Ataxia (FNF/Heel-Shin) - No Ataxia + 0 8: Test Sensation - Mild-Moderate Loss: Less Sharp/More Dull + 1 9: Test Language/Aphasia - Mild-Moderate Aphasia: Some Obvious Changes, Without Significant Limitation + 1 10: Test Dysarthria - Normal + 0 11: Test Extinction/Inattention - No abnormality + 0  NIHSS Score: 8  Patient/Family was informed the Neurology Consult would happen via TeleHealth consult by way of interactive audio and video telecommunications and consented to receiving care in this manner.  Due to the immediate potential for life-threatening deterioration due to underlying acute neurologic illness, I spent 62 minutes providing critical care. This time includes time for  face to face visit via telemedicine, review of medical records, imaging studies and discussion of findings with providers, the patient and/or family.   Dr Elenor Quinones   [facility] [phone]   Case [id number]

## 2019-01-30 NOTE — Progress Notes (Addendum)
CODE STROKE- PHARMACY COMMUNICATION   Time CODE STROKE called/page received: 1858  Time response to CODE STROKE was made (in person or via phone): 1859  Time Stroke Kit retrieved from [employer] (only if needed):  Pulled and Mixed 1928, admin 2004  Name of Provider/Nurse contacted: Cynthia Howard, Cynthia Howard  No past medical history on file. Prior to Admission medications   Not on File    Cynthia Howard, PharmD, BCPS Clinical Pharmacist 01/30/2019 6:59 PM   

## 2019-01-30 NOTE — ED Notes (Signed)
Pt currently in CT for CTA to rule out dissection. Per Dr. Jari Pigg, rule out dissection prior to administration of TPA, teleneuro doc aware of delay in TPA administration.

## 2019-01-30 NOTE — ED Triage Notes (Addendum)
At approximately 1700hrs, patient began to have numbness in her left side. Patient is currently exhibiting left sided weakness. Patient's father had a stroke last year. Patient is not on blood thinners. Slurred speech, numbness/tingling in arm began en route to hospital. Patient has unstable gait upon arrival to hospital; patient ambulated into hospital.

## 2019-01-30 NOTE — ED Notes (Addendum)
Notified MD of transient return of symptoms for about one minute. Contacted stroke team [place], [place]. Neurologist to return contact. Patient unable to move left leg or arm during transient episode.

## 2019-01-30 NOTE — ED Notes (Signed)
TPA mixed by Jinny Blossom, RN, 10mg  waste witnessed by this RN and April, Therapist, sports.

## 2019-01-30 NOTE — ED Notes (Signed)
Purewick placed for pt comfort during urination.

## 2019-01-30 NOTE — ED Notes (Signed)
Returned from ct scan. [facility] neurologist assessing pt at this time.

## 2019-01-30 NOTE — ED Notes (Signed)
Dr. Jari Pigg gave permission to start TPA.

## 2019-01-31 ENCOUNTER — Observation Stay: Payer: Medicaid Other

## 2019-01-31 ENCOUNTER — Inpatient Hospital Stay (HOSPITAL_COMMUNITY)
Admit: 2019-01-31 | Discharge: 2019-01-31 | Disposition: A | Discharge status: Home / Self Care | Payer: Medicaid Other | Attending: Nurse Practitioner | Admitting: Nurse Practitioner

## 2019-01-31 ENCOUNTER — Emergency Department: Payer: Medicaid Other

## 2019-01-31 DIAGNOSIS — R2990 Unspecified symptoms and signs involving the nervous system: Secondary | ICD-10-CM | POA: Diagnosis present

## 2019-01-31 DIAGNOSIS — G8194 Hemiplegia, unspecified affecting left nondominant side: Secondary | ICD-10-CM

## 2019-01-31 DIAGNOSIS — R200 Anesthesia of skin: Secondary | ICD-10-CM | POA: Diagnosis present

## 2019-01-31 DIAGNOSIS — I639 Cerebral infarction, unspecified: Secondary | ICD-10-CM

## 2019-01-31 LAB — LIPID PANEL
Cholesterol: 194 mg/dL (ref 0–200)
HDL: 42 mg/dL (ref >40)
LDL Cholesterol: 134 mg/dL — ABNORMAL HIGH (ref 0–99)
Total CHOL/HDL Ratio: 4.6 RATIO
Triglycerides: 90 mg/dL (ref <150)
VLDL: 18 mg/dL (ref 0–40)

## 2019-01-31 LAB — BASIC METABOLIC PANEL
Anion gap: 6 (ref 5–15)
BUN: 11 mg/dL (ref 6–20)
CO2: 25 mmol/L (ref 22–32)
Calcium: 8.3 mg/dL — ABNORMAL LOW (ref 8.9–10.3)
Chloride: 104 mmol/L (ref 98–111)
Creatinine, Ser: 0.65 mg/dL (ref 0.44–1.00)
GFR calc Af Amer: >60 mL/min (ref >60)
GFR calc non Af Amer: >60 mL/min (ref >60)
Glucose, Bld: 98 mg/dL (ref 70–99)
Potassium: 3.6 mmol/L (ref 3.5–5.1)
Sodium: 135 mmol/L (ref 135–145)

## 2019-01-31 LAB — URINE DRUG SCREEN, QUALITATIVE (ARMC ONLY)
Amphetamines, Ur Screen: NOT DETECTED
Barbiturates, Ur Screen: NOT DETECTED
Benzodiazepine, Ur Scrn: NOT DETECTED
Cannabinoid 50 Ng, Ur ~~LOC~~: NOT DETECTED
Cocaine Metabolite,Ur ~~LOC~~: NOT DETECTED
MDMA (Ecstasy)Ur Screen: NOT DETECTED
Methadone Scn, Ur: NOT DETECTED
Opiate, Ur Screen: NOT DETECTED
Phencyclidine (PCP) Ur S: NOT DETECTED
Tricyclic, Ur Screen: NOT DETECTED

## 2019-01-31 LAB — GLUCOSE, CAPILLARY
Glucose-Capillary: 102 mg/dL — ABNORMAL HIGH (ref 70–99)
Glucose-Capillary: 102 mg/dL — ABNORMAL HIGH (ref 70–99)
Glucose-Capillary: 137 mg/dL — ABNORMAL HIGH (ref 70–99)
Glucose-Capillary: 89 mg/dL (ref 70–99)

## 2019-01-31 LAB — PHOSPHORUS: Phosphorus: 4.2 mg/dL (ref 2.5–4.6)

## 2019-01-31 LAB — MRSA PCR SCREENING: MRSA by PCR: NEGATIVE

## 2019-01-31 LAB — HEMOGLOBIN A1C
Hgb A1c MFr Bld: 5 % (ref 4.8–5.6)
Mean Plasma Glucose: 96.8 mg/dL

## 2019-01-31 LAB — ECHOCARDIOGRAM COMPLETE
Height: 64 in
Weight: 3647.29 oz

## 2019-01-31 LAB — TROPONIN I (HIGH SENSITIVITY)
Troponin I (High Sensitivity): 14 ng/L (ref <18)
Troponin I (High Sensitivity): 9 ng/L (ref <18)

## 2019-01-31 LAB — MAGNESIUM: Magnesium: 2 mg/dL (ref 1.7–2.4)

## 2019-01-31 MED ORDER — ACETAMINOPHEN 160 MG/5ML PO SOLN
650.0000 mg | ORAL | Status: DC | PRN
  Filled 2019-01-31: qty 20.3

## 2019-01-31 MED ORDER — ATORVASTATIN CALCIUM 20 MG PO TABS
80.0000 mg | ORAL_TABLET | Freq: Every day | ORAL | Status: DC
  Administered 2019-01-31: 80 mg via ORAL
  Filled 2019-01-31: qty 4

## 2019-01-31 MED ORDER — INSULIN ASPART 100 UNIT/ML ~~LOC~~ SOLN
0.0000 [IU] | Freq: Three times a day (TID) | SUBCUTANEOUS | Status: DC
  Administered 2019-01-31: 1 [IU] via SUBCUTANEOUS
  Filled 2019-01-31: qty 1

## 2019-01-31 MED ORDER — ACETAMINOPHEN 325 MG PO TABS
650.0000 mg | ORAL_TABLET | ORAL | Status: DC | PRN
  Administered 2019-01-31 (×2): 650 mg via ORAL
  Filled 2019-01-31 (×2): qty 2

## 2019-01-31 MED ORDER — INSULIN ASPART 100 UNIT/ML ~~LOC~~ SOLN: 0.0000 [IU] | Freq: Three times a day (TID) | SUBCUTANEOUS | Status: DC

## 2019-01-31 MED ORDER — LAMOTRIGINE 25 MG PO TABS: 25.0000 mg | ORAL_TABLET | Freq: Two times a day (BID) | ORAL | Status: DC

## 2019-01-31 MED ORDER — IPRATROPIUM-ALBUTEROL 0.5-2.5 (3) MG/3ML IN SOLN
3.0000 mL | RESPIRATORY_TRACT | Status: DC | PRN
  Administered 2019-01-31: 3 mL via RESPIRATORY_TRACT
  Filled 2019-01-31: qty 3

## 2019-01-31 MED ORDER — STROKE: EARLY STAGES OF RECOVERY BOOK: Freq: Once | Status: DC

## 2019-01-31 MED ORDER — SODIUM CHLORIDE 0.9 % IV SOLN
INTRAVENOUS | Status: DC
  Administered 2019-01-31: 500 mL via INTRAVENOUS

## 2019-01-31 MED ORDER — INSULIN ASPART 100 UNIT/ML ~~LOC~~ SOLN: 0.0000 [IU] | Freq: Every day | SUBCUTANEOUS | Status: DC

## 2019-01-31 MED ORDER — FLUOXETINE HCL 20 MG PO CAPS
20.0000 mg | ORAL_CAPSULE | Freq: Every day | ORAL | Status: DC
  Administered 2019-01-31: 20 mg via ORAL
  Filled 2019-01-31: qty 1

## 2019-01-31 MED ORDER — PNEUMOCOCCAL VAC POLYVALENT 25 MCG/0.5ML IJ INJ: 0.5000 mL | INJECTION | INTRAMUSCULAR | Status: DC

## 2019-01-31 MED ORDER — ACETAMINOPHEN 650 MG RE SUPP: 650.0000 mg | RECTAL | Status: DC | PRN

## 2019-01-31 MED ORDER — INSULIN ASPART 100 UNIT/ML ~~LOC~~ SOLN: 0.0000 [IU] | SUBCUTANEOUS | Status: DC

## 2019-01-31 MED ORDER — ATORVASTATIN CALCIUM 40 MG PO TABS: 40.0000 mg | ORAL_TABLET | Freq: Every day | ORAL | 0 refills | Status: DC

## 2019-01-31 MED ORDER — ASPIRIN 81 MG PO TBEC: 81.0000 mg | DELAYED_RELEASE_TABLET | Freq: Every day | ORAL | 0 refills | Status: DC

## 2019-01-31 MED ORDER — LAMOTRIGINE 25 MG PO TABS
25.0000 mg | ORAL_TABLET | Freq: Two times a day (BID) | ORAL | Status: DC
  Administered 2019-01-31: 25 mg via ORAL
  Filled 2019-01-31: qty 1

## 2019-01-31 MED ORDER — SENNOSIDES-DOCUSATE SODIUM 8.6-50 MG PO TABS: 1.0000 | ORAL_TABLET | Freq: Every evening | ORAL | Status: DC | PRN

## 2019-01-31 MED ORDER — METOPROLOL SUCCINATE ER 25 MG PO TB24: 12.5000 mg | ORAL_TABLET | Freq: Every day | ORAL | 0 refills | Status: DC

## 2019-01-31 MED ORDER — METOPROLOL SUCCINATE ER 25 MG PO TB24
12.5000 mg | ORAL_TABLET | Freq: Every day | ORAL | Status: DC
  Administered 2019-01-31: 12.5 mg via ORAL
  Filled 2019-01-31: qty 0.5

## 2019-01-31 MED ORDER — GADOBUTROL 1 MMOL/ML IV SOLN
10.0000 mL | Freq: Once | INTRAVENOUS | Status: AC | PRN
  Administered 2019-01-31: 10 mL via INTRAVENOUS

## 2019-01-31 NOTE — Progress Notes (Signed)
Patient CT head negative.  Per Dr. Posey Pronto patient discharged.

## 2019-01-31 NOTE — Consult Note (Signed)
PULMONARY / CRITICAL CARE MEDICINE  Name: Cynthia Howard MRN: [id number] DOB: 1980-12-28    LOS: 0  Referring Provider: Janeann MerlAngela Seals, NP Reason for Referral: Code stroke Brief patient description: 38 year old female who presented with left upper and lower extremity weakness and speech difficulty, received TPA and now back to baseline with no residual deficits  HPI: This is a 38 year old female with a medical history as indicated below who presented to the ED with complaints of speech difficulty, facial asymmetry, and numbness/tingling on the left side of the body.  She also complained of left-sided chest pain which she describes as mild.  Symptoms started at about 5 PM and she arrived emergency room at about 7 PM.  Initial NIH stroke score was 8.  She was seen by tele-neurology and based on her clinical presentation, TPA was recommended after her initial CT head and CTA chest were negative.  Post TPA, she had a complete resolution of symptoms.  However a few hours post TPA, symptoms returned for about 1 minute and tele-neurology was reconsulted.  They recommended no further anti-thrombolytics within the next 24 hours.  Neurologist felt patient's symptoms were atypical as she reported significant pain in her left forearm when she gets the weakness.  Neurology also recommended MRI of the C-spine to rule out any compressive myeloradiculopathy.  Her MRI brain, lower extremity Dopplers and CTA head and neck were both negative.  She is being admitted to the ICU for further monitoring. She reports complete resolution of chest pain, left-sided weakness, numbness and tingling.  She complained of a mild headache that resolved with Tylenol.  Her speech is back to normal.  She denies any nausea, vomiting, visual changes, and dizziness. Per neurology, the possibility of known organic syndrome cannot be completely excluded. She recently moved out of her boyfriend's home-about 2 weeks ago and is currently  living with her mother.  She has 5 children for which she is responsible.  She is currently unemployed.  She is a current everyday smoker and smokes about 2 packs a day. She states that she was recently diagnosed with diabetes but did not return for follow-up so she was not started on any medications.  Hemoglobin A1c is pending.  Her lipid panel showed an LDL of 134.  Past Medical History:  Diagnosis Date  . Asthma   . Bipolar 1 disorder, mixed (HCC) 1996  . Diabetes mellitus without complication (HCC)    Diagnosed 2 weeks ago  . Mitral prolapse 1988   Past Surgical History:  Procedure Laterality Date  . CESAREAN SECTION     X5  . TUBAL LIGATION Bilateral 12/22/2011   No current facility-administered medications on file prior to encounter.    Current Outpatient Medications on File Prior to Encounter  Medication Sig  . FLUoxetine (PROZAC) 20 MG capsule Take 20 mg by mouth daily.  Marland Kitchen. lamoTRIgine (LAMICTAL) 25 MG tablet Take 25 mg by mouth 2 (two) times daily.    Allergies Allergies  Allergen Reactions  . Abilify [Aripiprazole] Shortness Of Breath  . Topamax [Topiramate] Rash    Family History Family History  Problem Relation Age of Onset  . Diabetes Father    Social History  reports that she has been smoking cigarettes. She has a 50.00 pack-year smoking history. She has never used smokeless tobacco. She reports previous alcohol use. She reports previous drug use.  Review Of Systems:  Constitutional: Negative for fever and chills.  HENT: Negative for congestion and rhinorrhea.  Eyes: Negative  for redness and visual disturbance.  Respiratory: Negative for shortness of breath and wheezing.  Cardiovascular: Chest pain has resolved.  Denies palpitations and edema Gastrointestinal: Negative  for nausea , vomiting and abdominal pain and  Loose stools Genitourinary: Negative for dysuria and urgency.  Endocrine: Denies polyuria, polyphagia and heat intolerance Musculoskeletal:  Negative for myalgias and arthralgias.  Skin: Negative for pallor and wound.  Neurological: Left-sided weakness numbness tingling and pain have resolved.  She denies dizziness  VITAL SIGNS: BP (!) 143/83   Pulse 80   Temp 98.3 F (36.8 C)   Resp 17   Ht  (1.626 m)   Wt 108.7 kg   LMP 01/09/2019 (Approximate) Comment: "My tubes are tied."  SpO2 99%   BMI 41.13 kg/m   HEMODYNAMICS:    VENTILATOR SETTINGS:    INTAKE / OUTPUT: No intake/output data recorded.  PHYSICAL EXAMINATION: General: Well-nourished, well-developed, in no acute distress HEENT: PERRLA, trachea midline, no JVD Neuro: Alert and oriented x4, moves all extremities, grip strength in bilateral upper extremities 4 on 5, and 5 on 5 in bilateral lower extremities. Interval: 10 hrs post tPA Level of Consciousness: LOC Questions (1b. )   +: Answers both questions correctly (07/20 0600) LOC Commands (1c. )   + : Performs both tasks correctly (07/20 0600) Best Gaze (2. )  +: Normal (07/20 0600) Visual (3. )  +: No visual loss (07/20 0600) Facial Palsy (4. )    : Normal symmetrical movements (07/20 0600) Motor Arm, Left (5a. )   +: No drift (07/20 0600) Motor Arm, Right (5b. )   +: No drift (07/20 0600) Motor Leg, Left (6a. )   +: No drift (07/20 0600) Motor Leg, Right (6b. )   +: No drift (07/20 0600) Limb Ataxia (7. ): Absent (07/20 0600) Sensory (8. )   +: Normal, no sensory loss (07/20 0600) Best Language (9. )   +: No aphasia (07/20 0600) Dysarthria (10. ): Normal (07/20 0600) Extinction/Inattention (11.)   +: No Abnormality (07/20 0600) Modified SS Total  +: 0 (07/20 0600) Complete NIHSS TOTAL: 0 (07/20 0600)  Cardiovascular: RRR, S1-S2, no murmur regurg or gallop, +2 pulses bilaterally, no edema Lungs: Clear to auscultation bilaterally Abdomen: Nondistended, normal bowel sounds in all 4 quadrants, palpation reveals no organomegaly Musculoskeletal: Positive range of motion, no joint deformities Skin:  Warm and dry  LABS:  BMET Recent Labs  Lab 01/30/19 1857  NA 138  K 3.7  CL 105  CO2 25  BUN 12  CREATININE 0.74  GLUCOSE 113*    Electrolytes Recent Labs  Lab 01/30/19 1857  CALCIUM 8.8*    CBC Recent Labs  Lab 01/30/19 1857  WBC 11.7*  HGB 12.9  HCT 37.6  PLT 268    Coag's Recent Labs  Lab 01/30/19 1857  APTT 29  INR 1.0    Sepsis Markers No results for input(s): LATICACIDVEN, PROCALCITON, O2SATVEN in the last 168 hours.  ABG No results for input(s): PHART, PCO2ART, PO2ART in the last 168 hours.  Liver Enzymes Recent Labs  Lab 01/30/19 1857  AST 15  ALT 21  ALKPHOS 81  BILITOT 0.2*  ALBUMIN 4.0    Cardiac Enzymes No results for input(s): TROPONINI, PROBNP in the last 168 hours.  Glucose Recent Labs  Lab 01/30/19 1856  GLUCAP 111*    Imaging Ct Code Stroke Cta Head W/wo Contrast  Result Date: 01/30/2019 CLINICAL DATA:  Left-sided numbness and weakness.  Slurred speech. EXAM:  CT ANGIOGRAPHY HEAD AND NECK TECHNIQUE: Multidetector CT imaging of the head and neck was performed using the standard protocol during bolus administration of intravenous contrast. Multiplanar CT image reconstructions and MIPs were obtained to evaluate the vascular anatomy. Carotid stenosis measurements (when applicable) are obtained utilizing NASCET criteria, using the distal internal carotid diameter as the denominator. CONTRAST:  125mL OMNIPAQUE IOHEXOL 350 MG/ML SOLN COMPARISON:  None. FINDINGS: CTA NECK FINDINGS Aortic arch: Standard 3 vessel aortic arch with widely patent arch vessel origins. Right carotid system: Patent and smooth without evidence of stenosis or dissection. Left carotid system: Patent and smooth without evidence of stenosis or dissection. Vertebral arteries: Patent and codominant without evidence of stenosis or dissection. Skeleton: Mild cervical spondylosis. Other neck: No evidence of cervical lymphadenopathy or mass. Upper chest: Reported  separately. Review of the MIP images confirms the above findings CTA HEAD FINDINGS Anterior circulation: The internal carotid arteries are widely patent from skull base to carotid termini. ACAs and MCAs are patent without evidence of proximal branch occlusion or flow limiting M1 stenosis. The left A1 segment is hypoplastic. Diffusely irregular appearance of the small and medium-sized vessels is felt to be artifactual related to image noise and mildly suboptimal arterial opacification, however this does preclude assessment for branch vessel stenosis. No aneurysm is identified. Posterior circulation: The intracranial vertebral arteries are widely patent to the basilar. Patent right PICA, left AICA, and bilateral SCA origins are visualized. There is a fetal origin of the left PCA. Both PCAs are patent without evidence of significant proximal stenosis. No aneurysm is identified. Venous sinuses: Patent. Anatomic variants: Fetal left PCA. Review of the MIP images confirms the above findings IMPRESSION: 1. Widely patent cervical carotid and vertebral arteries. 2. No intracranial large vessel occlusion or flow limiting proximal stenosis. Limited assessment of the small to medium-sized vessels. Electronically Signed   By: Sebastian AcheAllen  Grady M.D.   On: 01/30/2019 20:18   Ct Code Stroke Cta Neck W/wo Contrast  Result Date: 01/30/2019 CLINICAL DATA:  Left-sided numbness and weakness.  Slurred speech. EXAM: CT ANGIOGRAPHY HEAD AND NECK TECHNIQUE: Multidetector CT imaging of the head and neck was performed using the standard protocol during bolus administration of intravenous contrast. Multiplanar CT image reconstructions and MIPs were obtained to evaluate the vascular anatomy. Carotid stenosis measurements (when applicable) are obtained utilizing NASCET criteria, using the distal internal carotid diameter as the denominator. CONTRAST:  125mL OMNIPAQUE IOHEXOL 350 MG/ML SOLN COMPARISON:  None. FINDINGS: CTA NECK FINDINGS Aortic  arch: Standard 3 vessel aortic arch with widely patent arch vessel origins. Right carotid system: Patent and smooth without evidence of stenosis or dissection. Left carotid system: Patent and smooth without evidence of stenosis or dissection. Vertebral arteries: Patent and codominant without evidence of stenosis or dissection. Skeleton: Mild cervical spondylosis. Other neck: No evidence of cervical lymphadenopathy or mass. Upper chest: Reported separately. Review of the MIP images confirms the above findings CTA HEAD FINDINGS Anterior circulation: The internal carotid arteries are widely patent from skull base to carotid termini. ACAs and MCAs are patent without evidence of proximal branch occlusion or flow limiting M1 stenosis. The left A1 segment is hypoplastic. Diffusely irregular appearance of the small and medium-sized vessels is felt to be artifactual related to image noise and mildly suboptimal arterial opacification, however this does preclude assessment for branch vessel stenosis. No aneurysm is identified. Posterior circulation: The intracranial vertebral arteries are widely patent to the basilar. Patent right PICA, left AICA, and bilateral SCA origins are visualized.  There is a fetal origin of the left PCA. Both PCAs are patent without evidence of significant proximal stenosis. No aneurysm is identified. Venous sinuses: Patent. Anatomic variants: Fetal left PCA. Review of the MIP images confirms the above findings IMPRESSION: 1. Widely patent cervical carotid and vertebral arteries. 2. No intracranial large vessel occlusion or flow limiting proximal stenosis. Limited assessment of the small to medium-sized vessels. Electronically Signed   By: Logan Bores M.D.   On: 01/30/2019 20:18   Mr Jeri Cos ZH Contrast  Result Date: 01/31/2019 CLINICAL DATA:  Stroke follow-up EXAM: MRI HEAD WITHOUT AND WITH CONTRAST TECHNIQUE: Multiplanar, multiecho pulse sequences of the brain and surrounding structures were  obtained without and with intravenous contrast. CONTRAST:  10 mL Gadavist COMPARISON:  CTA head neck 01/30/2019. FINDINGS: Brain: There is no acute infarct, acute hemorrhage or extra-axial collection. The midline structures are normal. There is no midline shift or mass effect. The white matter signal is normal for the patient's age. The cerebral and cerebellar volume are age-appropriate. There is no hydrocephalus. Susceptibility-sensitive sequences show no chronic microhemorrhage or superficial siderosis. There is no abnormal contrast enhancement. Vascular: The major intracranial arterial and venous sinus flow voids are preserved. Skull and upper cervical spine: The visualized skull base, calvarium, upper cervical spine and extracranial soft tissues are normal. Sinuses/Orbits: No fluid levels or advanced mucosal thickening of the visualized paranasal sinuses. No mastoid or middle ear effusion. The orbits are normal. Other: None IMPRESSION: Normal brain. Electronically Signed   By: Ulyses Jarred M.D.   On: 01/31/2019 01:57   US Venous Img Lower Bilateral  Result Date: 01/31/2019 CLINICAL DATA:  38 year old female with bilateral lower extremity edema. EXAM: BILATERAL LOWER EXTREMITY VENOUS DOPPLER ULTRASOUND TECHNIQUE: Gray-scale sonography with graded compression, as well as color Doppler and duplex ultrasound were performed to evaluate the lower extremity deep venous systems from the level of the common femoral vein and including the common femoral, femoral, profunda femoral, popliteal and calf veins including the posterior tibial, peroneal and gastrocnemius veins when visible. The superficial great saphenous vein was also interrogated. Spectral Doppler was utilized to evaluate flow at rest and with distal augmentation maneuvers in the common femoral, femoral and popliteal veins. COMPARISON:  None. FINDINGS: RIGHT LOWER EXTREMITY Common Femoral Vein: No evidence of thrombus. Normal compressibility, respiratory  phasicity and response to augmentation. Saphenofemoral Junction: No evidence of thrombus. Normal compressibility and flow on color Doppler imaging. Profunda Femoral Vein: No evidence of thrombus. Normal compressibility and flow on color Doppler imaging. Femoral Vein: No evidence of thrombus. Normal compressibility, respiratory phasicity and response to augmentation. Popliteal Vein: No evidence of thrombus. Normal compressibility, respiratory phasicity and response to augmentation. Calf Veins: No evidence of thrombus. Normal compressibility and flow on color Doppler imaging. Superficial Great Saphenous Vein: No evidence of thrombus. Normal compressibility. Venous Reflux:  None. Other Findings:  None. LEFT LOWER EXTREMITY Common Femoral Vein: No evidence of thrombus. Normal compressibility, respiratory phasicity and response to augmentation. Saphenofemoral Junction: No evidence of thrombus. Normal compressibility and flow on color Doppler imaging. Profunda Femoral Vein: No evidence of thrombus. Normal compressibility and flow on color Doppler imaging. Femoral Vein: No evidence of thrombus. Normal compressibility, respiratory phasicity and response to augmentation. Popliteal Vein: No evidence of thrombus. Normal compressibility, respiratory phasicity and response to augmentation. Calf Veins: No evidence of thrombus. Normal compressibility and flow on color Doppler imaging. Superficial Great Saphenous Vein: No evidence of thrombus. Normal compressibility. Venous Reflux:  None. Other Findings:  None.  IMPRESSION: No evidence of deep venous thrombosis in either lower extremity. Electronically Signed   By: Elgie Collard M.D.   On: 01/31/2019 00:29   Ct Angio Chest/abd/pel For Dissection W And/or Wo Contrast  Result Date: 01/30/2019 CLINICAL DATA:  Left-sided weakness and numbness today. Unstable gait. EXAM: CT ANGIOGRAPHY CHEST, ABDOMEN AND PELVIS TECHNIQUE: Multidetector CT imaging through the chest, abdomen and  pelvis was performed using the standard protocol during bolus administration of intravenous contrast. Multiplanar reconstructed images and MIPs were obtained and reviewed to evaluate the vascular anatomy. Pre contrast images were also obtained through the chest. CONTRAST:  OMNIPAQUE IOHEXOL 350 MG/ML SOLN COMPARISON:  None. FINDINGS: CTA CHEST FINDINGS Cardiovascular: Heart is normal size. Thoracic aorta is normal in caliber without dissection or aneurysm. Remaining vascular structures are unremarkable. Mediastinum/Nodes: No mediastinal or hilar adenopathy. Remaining mediastinal structures are normal. Lungs/Pleura: Lungs are well inflated without focal airspace consolidation or effusion. Airways are normal. Musculoskeletal: Unremarkable. Review of the MIP images confirms the above findings. CTA ABDOMEN AND PELVIS FINDINGS VASCULAR Aorta: Normal caliber aorta without aneurysm, dissection, vasculitis or significant stenosis. Celiac: Patent without evidence of aneurysm, dissection, vasculitis or significant stenosis. SMA: Patent without evidence of aneurysm, dissection, vasculitis or significant stenosis. Renals: Both renal arteries are patent without evidence of aneurysm, dissection, vasculitis, fibromuscular dysplasia or significant stenosis. IMA: Patent without evidence of aneurysm, dissection, vasculitis or significant stenosis. Inflow: Patent without evidence of aneurysm, dissection, vasculitis or significant stenosis. Veins: No obvious venous abnormality within the limitations of this arterial phase study. Review of the MIP images confirms the above findings. NON-VASCULAR Hepatobiliary: Gallbladder is contracted. Liver and biliary tree are normal. Pancreas: Normal. Spleen: Normal. Adrenals/Urinary Tract: Adrenal glands are normal. Kidneys are normal in size without hydronephrosis or nephrolithiasis. Ureters and bladder are normal. Stomach/Bowel: Stomach and small bowel are normal. Appendix is normal. Mild  diverticulosis of the colon which is otherwise normal. Lymphatic: No adenopathy. Reproductive: Uterus is normal. Ovaries are high in position but otherwise unremarkable. Other: No free fluid or focal inflammatory change. Musculoskeletal: Unremarkable. Review of the MIP images confirms the above findings. IMPRESSION: Normal thoracoabdominal aorta without aneurysm or dissection. No acute findings in the chest, abdomen or pelvis. Minimal diverticulosis of the colon. Electronically Signed   By: Elberta Fortis M.D.   On: 01/30/2019 19:56   Ct Head Code Stroke Wo Contrast  Result Date: 01/30/2019 CLINICAL DATA:  Code stroke.  Weakness and confusion EXAM: CT HEAD WITHOUT CONTRAST TECHNIQUE: Contiguous axial images were obtained from the base of the skull through the vertex without intravenous contrast. COMPARISON:  02/18/2014 FINDINGS: Brain: There is no mass, hemorrhage or extra-axial collection. The size and configuration of the ventricles and extra-axial CSF spaces are normal. The brain parenchyma is normal, without evidence of acute or chronic infarction. Vascular: No abnormal hyperdensity of the major intracranial arteries or dural venous sinuses. No intracranial atherosclerosis. Skull: The visualized skull base, calvarium and extracranial soft tissues are normal. Sinuses/Orbits: No fluid levels or advanced mucosal thickening of the visualized paranasal sinuses. No mastoid or middle ear effusion. The orbits are normal. ASPECTS [facility] Stroke Program Early CT Score) - Ganglionic level infarction (caudate, lentiform nuclei, internal capsule, insula, M1-M3 cortex): 7 - Supraganglionic infarction (M4-M6 cortex): 3 Total score (0-10 with 10 being normal): 10 IMPRESSION: 1. Normal head CT. 2. ASPECTS is 10. These results were called by telephone at the time of interpretation on 01/30/2019 at 7:16 pm to Dr. Artis Delay , who verbally acknowledged these  results. Electronically Signed   By: Deatra RobinsonKevin  Herman M.D.   On:  01/30/2019 19:16   STUDIES:  2D echo pending Bilateral carotid Dopplers pending  CULTURES: None  ANTIBIOTICS: None  SIGNIFICANT EVENTS: 01/30/2019: Presented to the ED with stroke symptoms, received TPA and admitted to the ICU for monitoring  LINES/TUBES: Peripheral IVs  DISCUSSION: 38 year old female presenting with sudden onset left-sided weakness, numbness, tingling and speech difficulty suggestive of acute CVA, received TPA with complete resolution of symptoms, had a recurrence of symptoms transiently and now at baseline.  All imaging studies negative.  Unclear if this was an actual CVA or a psychosomatic syndrome.  ASSESSMENT  Acute CVA status post TPA-imaging studies negative Chest pain-resolved Hyperlipidemia-LDL 134 Morbid obesity-BMI of 39.1 Questionable type 2 diabetes-hemoglobin A1c pending Asthma Bipolar disorder Tobacco use disorder  PLAN Hemodynamic monitoring per ICU protocol Neuro checks every 2 hours Follow-up 2D echo and carotid Dopplers Neurology aware and following already Start Lipitor 80 mg daily Cycle cardiac enzymes Smoking cessation advice given, patient declined nicotine patch DuoNeb as needed for asthma Resume all home psychiatric medications Follow-up repeat CT head in 24 hours from TPA time Follow-up hemoglobin A1c PT, OT and speech evaluation Keep n.p.o. until speech evaluation  Best Practice: Code Status: Full code Diet: N.p.o. GI prophylaxis: Not indicated VTE prophylaxis: SCD  FAMILY  - Updates: Patient has decision-making capacity.  Plan of care reviewed with patient.  Will update with any changes in plan   S. Tukov-Yual ANP-BC Pulmonary and Critical Care Medicine [facility] Pager [phone] or [phone]  NB: This document was prepared using Dragon voice recognition software and may include unintentional dictation errors.    01/31/2019, 2:08 AM

## 2019-01-31 NOTE — Discharge Summary (Signed)
[facility] [place] [facility]   PATIENT NAME: Cynthia Howard    MR#:  [id number]  DATE OF BIRTH:  11-15-1980  DATE OF ADMISSION:  01/30/2019 ADMITTING PHYSICIAN: Hannah BeatJan A Mansy, MD  DATE OF DISCHARGE: 01/31/2019  PRIMARY CARE PHYSICIAN: [facility]    ADMISSION DIAGNOSIS:  Stroke [facility](HCC) [I63.9] Acute left-sided weakness [R53.1] Edema of left lower extremity [R60.0]  DISCHARGE DIAGNOSIS:  TIA  SECONDARY DIAGNOSIS:   Past Medical History:  Diagnosis Date  . Asthma   . Bipolar 1 disorder, mixed (HCC) 1996  . Diabetes mellitus without complication (HCC)    Diagnosed 2 weeks ago  . Mitral prolapse 1988    HOSPITAL COURSE:  Cynthia Howard  is a 38 y.o. female with a known history of bipolar 1 disorder, diabetes mellitus, mitral valve prolapse, and asthma.  She presented to the emergency room reporting strokelike symptoms.  1. TIA - Patient is status post administration of TPA therapy with tele-neurology and in house neurology consultation - Continue neurological checks - Repeat CT brain in 24 hours as recommended - aspirin therapy  - Telemetry monitoring--NSR - echocardiogram looks ok  and no indication for carotid Doppler studies per Neurolgoy - Urine drug screen negative - Statin therapy initiated  2.  Diabetes mellitus - Moderate sliding scale insulin -Hemoglobin A1c 5.0    3.  Bipolar 1 disorder - cont fluoxetine, Lamictal and Prozac  4.  Left lower extremity edema - No evidence of DVT present on venous ultrasound   DVT and PPI prophylaxis initiated  Patient overall feels back to baseline. She did very well with physical therapy. No focal neural deficit. She feels back to baseline. Will repeat CT head. Patient wishes to discharge earlier to be home with her young kids.  CONSULTS OBTAINED:    DRUG ALLERGIES:   Allergies  Allergen Reactions  . Abilify [Aripiprazole] Shortness Of Breath  . Topamax  [Topiramate] Rash    DISCHARGE MEDICATIONS:   Allergies as of 01/31/2019      Reactions   Abilify [aripiprazole] Shortness Of Breath   Topamax [topiramate] Rash      Medication List    TAKE these medications   aspirin 81 MG EC tablet Take 1 tablet (81 mg total) by mouth daily. Start taking on: February 02, 2019   atorvastatin 40 MG tablet Commonly known as: LIPITOR Take 1 tablet (40 mg total) by mouth daily at 6 PM.   FLUoxetine 20 MG capsule Commonly known as: PROZAC Take 20 mg by mouth daily.   lamoTRIgine 100 MG tablet Commonly known as: LAMICTAL Take 50 mg by mouth 2 (two) times daily.   metoprolol succinate 25 MG 24 hr tablet Commonly known as: TOPROL-XL Take 0.5 tablets (12.5 mg total) by mouth daily. Start taking on: February 01, 2019   OLANZapine 5 MG tablet Commonly known as: ZYPREXA Take 5 mg by mouth at bedtime.   traZODone 50 MG tablet Commonly known as: DESYREL Take 50 mg by mouth at bedtime.       If you experience worsening of your admission symptoms, develop shortness of breath, life threatening emergency, suicidal or homicidal thoughts you must seek medical attention immediately by calling 911 or calling your MD immediately  if symptoms less severe.  You Must read complete instructions/literature along with all the possible adverse reactions/side effects for all the Medicines you take and that have been prescribed to you. Take any new Medicines after you have completely understood and  accept all the possible adverse reactions/side effects.   Please note  You were cared for by a hospitalist during your hospital stay. If you have any questions about your discharge medications or the care you received while you were in the hospital after you are discharged, you can call the unit and asked to speak with the hospitalist on call if the hospitalist that took care of you is not available. Once you are discharged, your primary care physician will handle any further  medical issues. Please note that NO REFILLS for any discharge medications will be authorized once you are discharged, as it is imperative that you return to your primary care physician (or establish a relationship with a primary care physician if you do not have one) for your aftercare needs so that they can reassess your need for medications and monitor your lab values. Today   SUBJECTIVE   No new complaints  VITAL SIGNS:  Blood pressure 127/76, pulse 68, temperature 98.3 F (36.8 C), temperature source Tympanic, resp. rate 15, height  (1.626 m), weight 103.4 kg, last menstrual period 01/09/2019, SpO2 96 %.  I/O:    Intake/Output Summary (Last 24 hours) at 01/31/2019 1521 Last data filed at 01/31/2019 1250 Gross per 24 hour  Intake 363.3 ml  Output -  Net 363.3 ml    PHYSICAL EXAMINATION:  GENERAL:  38 y.o.-year-old patient lying in the bed with no acute distress.  EYES: Pupils equal, round, reactive to light and accommodation. No scleral icterus. Extraocular muscles intact.  HEENT: Head atraumatic, normocephalic. Oropharynx and nasopharynx clear.  NECK:  Supple, no jugular venous distention. No thyroid enlargement, no tenderness.  LUNGS: Normal breath sounds bilaterally, no wheezing, rales,rhonchi or crepitation. No use of accessory muscles of respiration.  CARDIOVASCULAR: S1, S2 normal. No murmurs, rubs, or gallops.  ABDOMEN: Soft, non-tender, non-distended. Bowel sounds present. No organomegaly or mass.  EXTREMITIES: No pedal edema, cyanosis, or clubbing.  NEUROLOGIC: Cranial nerves II through XII are intact. Muscle strength 5/5 in all extremities. Sensation intact. Gait not checked.  PSYCHIATRIC: The patient is alert and oriented x 3.  SKIN: No obvious rash, lesion, or ulcer.   DATA REVIEW:   CBC  Recent Labs  Lab 01/30/19 1857  WBC 11.7*  HGB 12.9  HCT 37.6  PLT 268    Chemistries  Recent Labs  Lab 01/30/19 1857 01/31/19 0308  NA 138 135  K 3.7 3.6  CL  105 104  CO2 25 25  GLUCOSE 113* 98  BUN 12 11  CREATININE 0.74 0.65  CALCIUM 8.8* 8.3*  MG  --  2.0  AST 15  --   ALT 21  --   ALKPHOS 81  --   BILITOT 0.2*  --     Microbiology Results   Recent Results (from the past 240 hour(s))  SARS Coronavirus 2 (CEPHEID - Performed in [facility] hospital lab), Hosp Order     Status: None   Collection Time: 01/30/19  7:52 PM   Specimen: Nasopharyngeal Swab  Result Value Ref Range Status   SARS Coronavirus 2 NEGATIVE NEGATIVE Final    Comment: (NOTE) If result is NEGATIVE SARS-CoV-2 target nucleic acids are NOT DETECTED. The SARS-CoV-2 RNA is generally detectable in upper and lower  respiratory specimens during the acute phase of infection. The lowest  concentration of SARS-CoV-2 viral copies this assay can detect is 250  copies / mL. A negative result does not preclude SARS-CoV-2 infection  and should not be used as  the sole basis for treatment or other  patient management decisions.  A negative result may occur with  improper specimen collection / handling, submission of specimen other  than nasopharyngeal swab, presence of viral mutation(s) within the  areas targeted by this assay, and inadequate number of viral copies  (<250 copies / mL). A negative result must be combined with clinical  observations, patient history, and epidemiological information. If result is POSITIVE SARS-CoV-2 target nucleic acids are DETECTED. The SARS-CoV-2 RNA is generally detectable in upper and lower  respiratory specimens dur ing the acute phase of infection.  Positive  results are indicative of active infection with SARS-CoV-2.  Clinical  correlation with patient history and other diagnostic information is  necessary to determine patient infection status.  Positive results do  not rule out bacterial infection or co-infection with other viruses. If result is PRESUMPTIVE POSTIVE SARS-CoV-2 nucleic acids MAY BE PRESENT.   A presumptive positive result  was obtained on the submitted specimen  and confirmed on repeat testing.  While 2019 novel coronavirus  (SARS-CoV-2) nucleic acids may be present in the submitted sample  additional confirmatory testing may be necessary for epidemiological  and / or clinical management purposes  to differentiate between  SARS-CoV-2 and other Sarbecovirus currently known to infect humans.  If clinically indicated additional testing with an alternate test  methodology [phone]) is advised. The SARS-CoV-2 RNA is generally  detectable in upper and lower respiratory sp ecimens during the acute  phase of infection. The expected result is Negative. Fact Sheet for Patients:  BoilerBrush.com.cy Fact Sheet for Healthcare Providers: https://pope.com/ This test is not yet approved or cleared by the [place] FDA and has been authorized for detection and/or diagnosis of SARS-CoV-2 by FDA under an Emergency Use Authorization (EUA).  This EUA will remain in effect (meaning this test can be used) for the duration of the COVID-19 declaration under Section 564(b)(1) of the Act, 21 U.S.C. section 360bbb-3(b)(1), unless the authorization is terminated or revoked sooner. Performed at [facility], [address] [place], [place] [address]   MRSA PCR Screening     Status: None   Collection Time: 01/31/19  2:31 AM   Specimen: Nasopharyngeal  Result Value Ref Range Status   MRSA by PCR NEGATIVE NEGATIVE Final    Comment:        The GeneXpert MRSA Assay (FDA approved for NASAL specimens only), is one component of a comprehensive MRSA colonization surveillance program. It is not intended to diagnose MRSA infection nor to guide or monitor treatment for MRSA infections. Performed at [facility], [address] [place], [place] [address]     RADIOLOGY:  Ct Code Stroke Cta Head W/wo Contrast  Result Date: 01/30/2019 CLINICAL DATA:  Left-sided  numbness and weakness.  Slurred speech. EXAM: CT ANGIOGRAPHY HEAD AND NECK TECHNIQUE: Multidetector CT imaging of the head and neck was performed using the standard protocol during bolus administration of intravenous contrast. Multiplanar CT image reconstructions and MIPs were obtained to evaluate the vascular anatomy. Carotid stenosis measurements (when applicable) are obtained utilizing NASCET criteria, using the distal internal carotid diameter as the denominator. CONTRAST:  OMNIPAQUE IOHEXOL 350 MG/ML SOLN COMPARISON:  None. FINDINGS: CTA NECK FINDINGS Aortic arch: Standard 3 vessel aortic arch with widely patent arch vessel origins. Right carotid system: Patent and smooth without evidence of stenosis or dissection. Left carotid system: Patent and smooth without evidence of stenosis or dissection. Vertebral arteries: Patent and codominant without evidence of stenosis  or dissection. Skeleton: Mild cervical spondylosis. Other neck: No evidence of cervical lymphadenopathy or mass. Upper chest: Reported separately. Review of the MIP images confirms the above findings CTA HEAD FINDINGS Anterior circulation: The internal carotid arteries are widely patent from skull base to carotid termini. ACAs and MCAs are patent without evidence of proximal branch occlusion or flow limiting M1 stenosis. The left A1 segment is hypoplastic. Diffusely irregular appearance of the small and medium-sized vessels is felt to be artifactual related to image noise and mildly suboptimal arterial opacification, however this does preclude assessment for branch vessel stenosis. No aneurysm is identified. Posterior circulation: The intracranial vertebral arteries are widely patent to the basilar. Patent right PICA, left AICA, and bilateral SCA origins are visualized. There is a fetal origin of the left PCA. Both PCAs are patent without evidence of significant proximal stenosis. No aneurysm is identified. Venous sinuses: Patent. Anatomic  variants: Fetal left PCA. Review of the MIP images confirms the above findings IMPRESSION: 1. Widely patent cervical carotid and vertebral arteries. 2. No intracranial large vessel occlusion or flow limiting proximal stenosis. Limited assessment of the small to medium-sized vessels. Electronically Signed   By: Sebastian AcheAllen  Grady M.D.   On: 01/30/2019 20:18   Ct Code Stroke Cta Neck W/wo Contrast  Result Date: 01/30/2019 CLINICAL DATA:  Left-sided numbness and weakness.  Slurred speech. EXAM: CT ANGIOGRAPHY HEAD AND NECK TECHNIQUE: Multidetector CT imaging of the head and neck was performed using the standard protocol during bolus administration of intravenous contrast. Multiplanar CT image reconstructions and MIPs were obtained to evaluate the vascular anatomy. Carotid stenosis measurements (when applicable) are obtained utilizing NASCET criteria, using the distal internal carotid diameter as the denominator. CONTRAST:  125mL OMNIPAQUE IOHEXOL 350 MG/ML SOLN COMPARISON:  None. FINDINGS: CTA NECK FINDINGS Aortic arch: Standard 3 vessel aortic arch with widely patent arch vessel origins. Right carotid system: Patent and smooth without evidence of stenosis or dissection. Left carotid system: Patent and smooth without evidence of stenosis or dissection. Vertebral arteries: Patent and codominant without evidence of stenosis or dissection. Skeleton: Mild cervical spondylosis. Other neck: No evidence of cervical lymphadenopathy or mass. Upper chest: Reported separately. Review of the MIP images confirms the above findings CTA HEAD FINDINGS Anterior circulation: The internal carotid arteries are widely patent from skull base to carotid termini. ACAs and MCAs are patent without evidence of proximal branch occlusion or flow limiting M1 stenosis. The left A1 segment is hypoplastic. Diffusely irregular appearance of the small and medium-sized vessels is felt to be artifactual related to image noise and mildly suboptimal arterial  opacification, however this does preclude assessment for branch vessel stenosis. No aneurysm is identified. Posterior circulation: The intracranial vertebral arteries are widely patent to the basilar. Patent right PICA, left AICA, and bilateral SCA origins are visualized. There is a fetal origin of the left PCA. Both PCAs are patent without evidence of significant proximal stenosis. No aneurysm is identified. Venous sinuses: Patent. Anatomic variants: Fetal left PCA. Review of the MIP images confirms the above findings IMPRESSION: 1. Widely patent cervical carotid and vertebral arteries. 2. No intracranial large vessel occlusion or flow limiting proximal stenosis. Limited assessment of the small to medium-sized vessels. Electronically Signed   By: Sebastian AcheAllen  Grady M.D.   On: 01/30/2019 20:18   Mr Laqueta JeanBrain W ZOWo Contrast  Result Date: 01/31/2019 CLINICAL DATA:  Stroke follow-up EXAM: MRI HEAD WITHOUT AND WITH CONTRAST TECHNIQUE: Multiplanar, multiecho pulse sequences of the brain and surrounding structures were obtained without  and with intravenous contrast. CONTRAST:  10 mL Gadavist COMPARISON:  CTA head neck 01/30/2019. FINDINGS: Brain: There is no acute infarct, acute hemorrhage or extra-axial collection. The midline structures are normal. There is no midline shift or mass effect. The white matter signal is normal for the patient's age. The cerebral and cerebellar volume are age-appropriate. There is no hydrocephalus. Susceptibility-sensitive sequences show no chronic microhemorrhage or superficial siderosis. There is no abnormal contrast enhancement. Vascular: The major intracranial arterial and venous sinus flow voids are preserved. Skull and upper cervical spine: The visualized skull base, calvarium, upper cervical spine and extracranial soft tissues are normal. Sinuses/Orbits: No fluid levels or advanced mucosal thickening of the visualized paranasal sinuses. No mastoid or middle ear effusion. The orbits are  normal. Other: None IMPRESSION: Normal brain. Electronically Signed   By: Deatra Robinson M.D.   On: 01/31/2019 01:57   US Venous Img Lower Bilateral  Result Date: 01/31/2019 CLINICAL DATA:  38 year old female with bilateral lower extremity edema. EXAM: BILATERAL LOWER EXTREMITY VENOUS DOPPLER ULTRASOUND TECHNIQUE: Gray-scale sonography with graded compression, as well as color Doppler and duplex ultrasound were performed to evaluate the lower extremity deep venous systems from the level of the common femoral vein and including the common femoral, femoral, profunda femoral, popliteal and calf veins including the posterior tibial, peroneal and gastrocnemius veins when visible. The superficial great saphenous vein was also interrogated. Spectral Doppler was utilized to evaluate flow at rest and with distal augmentation maneuvers in the common femoral, femoral and popliteal veins. COMPARISON:  None. FINDINGS: RIGHT LOWER EXTREMITY Common Femoral Vein: No evidence of thrombus. Normal compressibility, respiratory phasicity and response to augmentation. Saphenofemoral Junction: No evidence of thrombus. Normal compressibility and flow on color Doppler imaging. Profunda Femoral Vein: No evidence of thrombus. Normal compressibility and flow on color Doppler imaging. Femoral Vein: No evidence of thrombus. Normal compressibility, respiratory phasicity and response to augmentation. Popliteal Vein: No evidence of thrombus. Normal compressibility, respiratory phasicity and response to augmentation. Calf Veins: No evidence of thrombus. Normal compressibility and flow on color Doppler imaging. Superficial Great Saphenous Vein: No evidence of thrombus. Normal compressibility. Venous Reflux:  None. Other Findings:  None. LEFT LOWER EXTREMITY Common Femoral Vein: No evidence of thrombus. Normal compressibility, respiratory phasicity and response to augmentation. Saphenofemoral Junction: No evidence of thrombus. Normal  compressibility and flow on color Doppler imaging. Profunda Femoral Vein: No evidence of thrombus. Normal compressibility and flow on color Doppler imaging. Femoral Vein: No evidence of thrombus. Normal compressibility, respiratory phasicity and response to augmentation. Popliteal Vein: No evidence of thrombus. Normal compressibility, respiratory phasicity and response to augmentation. Calf Veins: No evidence of thrombus. Normal compressibility and flow on color Doppler imaging. Superficial Great Saphenous Vein: No evidence of thrombus. Normal compressibility. Venous Reflux:  None. Other Findings:  None. IMPRESSION: No evidence of deep venous thrombosis in either lower extremity. Electronically Signed   By: Elgie Collard M.D.   On: 01/31/2019 00:29   Ct Angio Chest/abd/pel For Dissection W And/or Wo Contrast  Result Date: 01/30/2019 CLINICAL DATA:  Left-sided weakness and numbness today. Unstable gait. EXAM: CT ANGIOGRAPHY CHEST, ABDOMEN AND PELVIS TECHNIQUE: Multidetector CT imaging through the chest, abdomen and pelvis was performed using the standard protocol during bolus administration of intravenous contrast. Multiplanar reconstructed images and MIPs were obtained and reviewed to evaluate the vascular anatomy. Pre contrast images were also obtained through the chest. CONTRAST:  OMNIPAQUE IOHEXOL 350 MG/ML SOLN COMPARISON:  None. FINDINGS: CTA CHEST FINDINGS Cardiovascular: Heart  is normal size. Thoracic aorta is normal in caliber without dissection or aneurysm. Remaining vascular structures are unremarkable. Mediastinum/Nodes: No mediastinal or hilar adenopathy. Remaining mediastinal structures are normal. Lungs/Pleura: Lungs are well inflated without focal airspace consolidation or effusion. Airways are normal. Musculoskeletal: Unremarkable. Review of the MIP images confirms the above findings. CTA ABDOMEN AND PELVIS FINDINGS VASCULAR Aorta: Normal caliber aorta without aneurysm, dissection,  vasculitis or significant stenosis. Celiac: Patent without evidence of aneurysm, dissection, vasculitis or significant stenosis. SMA: Patent without evidence of aneurysm, dissection, vasculitis or significant stenosis. Renals: Both renal arteries are patent without evidence of aneurysm, dissection, vasculitis, fibromuscular dysplasia or significant stenosis. IMA: Patent without evidence of aneurysm, dissection, vasculitis or significant stenosis. Inflow: Patent without evidence of aneurysm, dissection, vasculitis or significant stenosis. Veins: No obvious venous abnormality within the limitations of this arterial phase study. Review of the MIP images confirms the above findings. NON-VASCULAR Hepatobiliary: Gallbladder is contracted. Liver and biliary tree are normal. Pancreas: Normal. Spleen: Normal. Adrenals/Urinary Tract: Adrenal glands are normal. Kidneys are normal in size without hydronephrosis or nephrolithiasis. Ureters and bladder are normal. Stomach/Bowel: Stomach and small bowel are normal. Appendix is normal. Mild diverticulosis of the colon which is otherwise normal. Lymphatic: No adenopathy. Reproductive: Uterus is normal. Ovaries are high in position but otherwise unremarkable. Other: No free fluid or focal inflammatory change. Musculoskeletal: Unremarkable. Review of the MIP images confirms the above findings. IMPRESSION: Normal thoracoabdominal aorta without aneurysm or dissection. No acute findings in the chest, abdomen or pelvis. Minimal diverticulosis of the colon. Electronically Signed   By: Marin Olp M.D.   On: 01/30/2019 19:56   Ct Head Code Stroke Wo Contrast  Result Date: 01/30/2019 CLINICAL DATA:  Code stroke.  Weakness and confusion EXAM: CT HEAD WITHOUT CONTRAST TECHNIQUE: Contiguous axial images were obtained from the base of the skull through the vertex without intravenous contrast. COMPARISON:  02/18/2014 FINDINGS: Brain: There is no mass, hemorrhage or extra-axial collection.  The size and configuration of the ventricles and extra-axial CSF spaces are normal. The brain parenchyma is normal, without evidence of acute or chronic infarction. Vascular: No abnormal hyperdensity of the major intracranial arteries or dural venous sinuses. No intracranial atherosclerosis. Skull: The visualized skull base, calvarium and extracranial soft tissues are normal. Sinuses/Orbits: No fluid levels or advanced mucosal thickening of the visualized paranasal sinuses. No mastoid or middle ear effusion. The orbits are normal. ASPECTS [facility] Stroke Program Early CT Score) - Ganglionic level infarction (caudate, lentiform nuclei, internal capsule, insula, M1-M3 cortex): 7 - Supraganglionic infarction (M4-M6 cortex): 3 Total score (0-10 with 10 being normal): 10 IMPRESSION: 1. Normal head CT. 2. ASPECTS is 10. These results were called by telephone at the time of interpretation on 01/30/2019 at 7:16 pm to Dr. Marjean Donna , who verbally acknowledged these results. Electronically Signed   By: Ulyses Jarred M.D.   On: 01/30/2019 19:16     CODE STATUS:     Code Status Orders  (From admission, onward)         Start     Ordered   01/31/19 0038  Full code  Continuous     01/31/19 0037        Code Status History    This patient has a current code status but no historical code status.   Advance Care Planning Activity      TOTAL TIME TAKING CARE OF THIS PATIENT: *40* minutes.    Fritzi Mandes M.D on 01/31/2019 at 3:21 PM  Between  7am to 6pm - Pager - [phone] After 6pm go to [email or web address] - Social research officer, governmentpassword [email or web address]  [facility]  Office  [phone]  CC: Primary care physician; [facility]

## 2019-01-31 NOTE — Evaluation (Signed)
SLP Cancellation Note  Patient Details Name: Cynthia Howard MRN: [id number] DOB: [birth year]   Cancelled treatment:       Reason Eval/Treat Not Completed: SLP screened, no needs identified, will sign off pt eating a regular diet upon st arrival. No complaints of difficulty. Speech appeared [facility]. Pt self reports all symptoms have resolved for speech and swallow. Educated pt to notify nsg and have ST come back if changes in status occur.    [place] Sauber 01/31/2019, 11:47 AM

## 2019-01-31 NOTE — Progress Notes (Signed)
*  PRELIMINARY RESULTS* Echocardiogram 2D Echocardiogram has been performed.  Cynthia Howard 01/31/2019, 11:42 AM

## 2019-01-31 NOTE — ED Notes (Signed)
Attempted to call report. Awaiting return call.

## 2019-01-31 NOTE — Progress Notes (Signed)
OT Cancellation Note  Patient Details Name: Cynthia Howard MRN: [id number] DOB: 1981/07/05   Cancelled Treatment:    Reason Eval/Treat Not Completed: Patient not medically ready. Consult received, chart reviewed. Patient noted with acute CVA work up with tPA administration (01/30/19, stopped at 21:20).  Per guidelines, patient to be on strict bedrest x24 hours and until repeat head CT completed, patient cleared for activity.  Will continue to follow and initiate as medically appropriate.    Jeni Salles, MPH, MS, OTR/L ascom [phone] 01/31/19, 8:09 AM

## 2019-01-31 NOTE — Progress Notes (Signed)
Patient stated that she needed to go home because she "needs to be with her children." Relayed this information to Dr. Posey Pronto, Dr. Gale Journey. and Dr. Alva Garnet.  Patient did have a follow-up CT of head at 2100- but ok per Dr. Gale Journey to have early and to be read prior to patient discharging.   Plan to have CT- if negative per Dr. Posey Pronto- she will discharge patient.

## 2019-01-31 NOTE — Progress Notes (Signed)
PT Cancellation Note  Patient Details Name: Cynthia Howard MRN: [id number] DOB: Jan 05, 1981   Cancelled Treatment:    Reason Eval/Treat Not Completed: Medical issues which prohibited therapy(Consult received and chart reviewed.  Patient admitted to ICU s/p CVA with tPA infusion (ending at 2140, 01/30/19).  Per guidelines, to be on strict bedrest x24 hours post infusion.  Will continue to follow and initiate as medically appropriate.)    Reneisha Stilley H. Owens Shark, PT, DPT, NCS 01/31/19, 8:12 AM [phone]

## 2019-01-31 NOTE — ED Notes (Signed)
ED TO INPATIENT HANDOFF REPORT  ED Nurse Name and Phone #:  Reuel Boom [phone]  S Name/Age/Gender Cynthia Howard 38 y.o. female Room/Bed: ED05A/ED05A  Code Status   Code Status: Full Code  Home/SNF/Other Home Patient oriented to: self, place, time and situation Is this baseline? Yes   Triage Complete: Triage complete  Chief Complaint L Side Pain  Triage Note At approximately 1700hrs, patient began to have numbness in her left side. Patient is currently exhibiting left sided weakness. Patient's father had a stroke last year. Patient is not on blood thinners. Slurred speech, numbness/tingling in arm began en route to hospital. Patient has unstable gait upon arrival to hospital; patient ambulated into hospital.   Allergies Allergies  Allergen Reactions  . Abilify [Aripiprazole] Shortness Of Breath  . Topamax [Topiramate] Rash    Level of Care/Admitting Diagnosis ED Disposition    ED Disposition Condition Comment   Admit  Hospital Area: [facility] [[id number]]  Level of Care: ICU [6]  Covid Evaluation: Confirmed COVID Negative  Diagnosis: Stroke [facility]) [[id number]]  Admitting Physician: Pearletha Alfred [[id number]]  Attending Physician: Pearletha Alfred [[id number]]  Estimated length of stay: past midnight tomorrow  Certification:: I certify this patient will need inpatient services for at least 2 midnights  PT Class (Do Not Modify): Inpatient [101]  PT Acc Code (Do Not Modify): Private [1]       B Medical/Surgery History Past Medical History:  Diagnosis Date  . Asthma   . Bipolar 1 disorder, mixed (HCC) 1996  . Diabetes mellitus without complication (HCC)    Diagnosed 2 weeks ago  . Mitral prolapse 1988   Past Surgical History:  Procedure Laterality Date  . CESAREAN SECTION     X5  . TUBAL LIGATION Bilateral 12/22/2011     A IV Location/Drains/Wounds Patient Lines/Drains/Airways Status   Active Line/Drains/Airways    Name:    Placement date:   Placement time:   Site:   Days:   Peripheral IV 01/30/19 Left Antecubital   01/30/19    1856    Antecubital   1   Peripheral IV 01/30/19 Right Antecubital   01/30/19    1913    Antecubital   1          Intake/Output Last 24 hours  Intake/Output Summary (Last 24 hours) at 01/31/2019 0041 Last data filed at 01/30/2019 2121 Gross per 24 hour  Intake 120 ml  Output -  Net 120 ml    Labs/Imaging Results for orders placed or performed during the hospital encounter of 01/30/19 (from the past 48 hour(s))  Glucose, capillary     Status: Abnormal   Collection Time: 01/30/19  6:56 PM  Result Value Ref Range   Glucose-Capillary 111 (H) 70 - 99 mg/dL  Protime-INR     Status: None   Collection Time: 01/30/19  6:57 PM  Result Value Ref Range   Prothrombin Time 12.7 11.4 - 15.2 seconds   INR 1.0 0.8 - 1.2    Comment: (NOTE) INR goal varies based on device and disease states. Performed at [facility], [address] [place], [place] [address]   APTT     Status: None   Collection Time: 01/30/19  6:57 PM  Result Value Ref Range   aPTT 29 24 - 36 seconds    Comment: Performed at [facility], [address] [place], [place] [address]  CBC     Status: Abnormal   Collection Time: 01/30/19  6:57 PM  Result Value Ref Range   WBC 11.7 (H) 4.0 - 10.5 K/uL   RBC 4.03 3.87 - 5.11 MIL/uL   Hemoglobin 12.9 12.0 - 15.0 g/dL   HCT 37.6 36.0 - 46.0 %   MCV 93.3 80.0 - 100.0 fL   MCH 32.0 26.0 - 34.0 pg   MCHC 34.3 30.0 - 36.0 g/dL   RDW 11.9 11.5 - 15.5 %   Platelets 268 150 - 400 K/uL   nRBC 0.0 0.0 - 0.2 %    Comment: Performed at [facility], [address] [place], [place] [address]  Differential     Status: None   Collection Time: 01/30/19  6:57 PM  Result Value Ref Range   Neutrophils Relative % 64 %   Neutro Abs 7.5 1.7 - 7.7 K/uL   Lymphocytes Relative 26 %   Lymphs Abs 3.0 0.7 - 4.0 K/uL   Monocytes Relative 5 %   Monocytes  Absolute 0.6 0.1 - 1.0 K/uL   Eosinophils Relative 4 %   Eosinophils Absolute 0.5 0.0 - 0.5 K/uL   Basophils Relative 1 %   Basophils Absolute 0.1 0.0 - 0.1 K/uL   Immature Granulocytes 0 %   Abs Immature Granulocytes 0.04 0.00 - 0.07 K/uL    Comment: Performed at [facility], [address] [place], [place] [address]  Comprehensive metabolic panel     Status: Abnormal   Collection Time: 01/30/19  6:57 PM  Result Value Ref Range   Sodium 138 135 - 145 mmol/L   Potassium 3.7 3.5 - 5.1 mmol/L   Chloride 105 98 - 111 mmol/L   CO2 25 22 - 32 mmol/L   Glucose, Bld 113 (H) 70 - 99 mg/dL   BUN 12 6 - 20 mg/dL   Creatinine, Ser 0.74 0.44 - 1.00 mg/dL   Calcium 8.8 (L) 8.9 - 10.3 mg/dL   Total Protein 7.0 6.5 - 8.1 g/dL   Albumin 4.0 3.5 - 5.0 g/dL   AST 15 15 - 41 U/L   ALT 21 0 - 44 U/L   Alkaline Phosphatase 81 38 - 126 U/L   Total Bilirubin 0.2 (L) 0.3 - 1.2 mg/dL   GFR calc non Af Amer >60 >60 mL/min   GFR calc Af Amer >60 >60 mL/min   Anion gap 8 5 - 15    Comment: Performed at [facility], [address], [place], [place] [address]  SARS Coronavirus 2 (CEPHEID - Performed in [place] hospital lab), Hosp Order     Status: None   Collection Time: 01/30/19  7:52 PM   Specimen: Nasopharyngeal Swab  Result Value Ref Range   SARS Coronavirus 2 NEGATIVE NEGATIVE    Comment: (NOTE) If result is NEGATIVE SARS-CoV-2 target nucleic acids are NOT DETECTED. The SARS-CoV-2 RNA is generally detectable in upper and lower  respiratory specimens during the acute phase of infection. The lowest  concentration of SARS-CoV-2 viral copies this assay can detect is 250  copies / mL. A negative result does not preclude SARS-CoV-2 infection  and should not be used as the sole basis for treatment or other  patient management decisions.  A negative result may occur with  improper specimen collection / handling, submission of specimen other  than nasopharyngeal swab, presence of  viral mutation(s) within the  areas targeted by this assay, and inadequate number of viral copies  (<250 copies / mL). A negative result must be combined with clinical  observations, patient  history, and epidemiological information. If result is POSITIVE SARS-CoV-2 target nucleic acids are DETECTED. The SARS-CoV-2 RNA is generally detectable in upper and lower  respiratory specimens dur ing the acute phase of infection.  Positive  results are indicative of active infection with SARS-CoV-2.  Clinical  correlation with patient history and other diagnostic information is  necessary to determine patient infection status.  Positive results do  not rule out bacterial infection or co-infection with other viruses. If result is PRESUMPTIVE POSTIVE SARS-CoV-2 nucleic acids MAY BE PRESENT.   A presumptive positive result was obtained on the submitted specimen  and confirmed on repeat testing.  While 2019 novel coronavirus  (SARS-CoV-2) nucleic acids may be present in the submitted sample  additional confirmatory testing may be necessary for epidemiological  and / or clinical management purposes  to differentiate between  SARS-CoV-2 and other Sarbecovirus currently known to infect humans.  If clinically indicated additional testing with an alternate test  methodology [phone]) is advised. The SARS-CoV-2 RNA is generally  detectable in upper and lower respiratory sp ecimens during the acute  phase of infection. The expected result is Negative. Fact Sheet for Patients:  BoilerBrush.com.cy Fact Sheet for Healthcare Providers: https://pope.com/ This test is not yet approved or cleared by the [place] FDA and has been authorized for detection and/or diagnosis of SARS-CoV-2 by FDA under an Emergency Use Authorization (EUA).  This EUA will remain in effect (meaning this test can be used) for the duration of the COVID-19 declaration under Section  564(b)(1) of the Act, 21 U.S.C. section 360bbb-3(b)(1), unless the authorization is terminated or revoked sooner. Performed at [facility], [address] [place], [place] [address]   Pregnancy, urine POC     Status: None   Collection Time: 01/30/19 10:07 PM  Result Value Ref Range   Preg Test, Ur NEGATIVE NEGATIVE    Comment:        THE SENSITIVITY OF THIS METHODOLOGY IS >24 mIU/mL    Ct Code Stroke Cta Head W/wo Contrast  Result Date: 01/30/2019 CLINICAL DATA:  Left-sided numbness and weakness.  Slurred speech. EXAM: CT ANGIOGRAPHY HEAD AND NECK TECHNIQUE: Multidetector CT imaging of the head and neck was performed using the standard protocol during bolus administration of intravenous contrast. Multiplanar CT image reconstructions and MIPs were obtained to evaluate the vascular anatomy. Carotid stenosis measurements (when applicable) are obtained utilizing NASCET criteria, using the distal internal carotid diameter as the denominator. CONTRAST:  OMNIPAQUE IOHEXOL 350 MG/ML SOLN COMPARISON:  None. FINDINGS: CTA NECK FINDINGS Aortic arch: Standard 3 vessel aortic arch with widely patent arch vessel origins. Right carotid system: Patent and smooth without evidence of stenosis or dissection. Left carotid system: Patent and smooth without evidence of stenosis or dissection. Vertebral arteries: Patent and codominant without evidence of stenosis or dissection. Skeleton: Mild cervical spondylosis. Other neck: No evidence of cervical lymphadenopathy or mass. Upper chest: Reported separately. Review of the MIP images confirms the above findings CTA HEAD FINDINGS Anterior circulation: The internal carotid arteries are widely patent from skull base to carotid termini. ACAs and MCAs are patent without evidence of proximal branch occlusion or flow limiting M1 stenosis. The left A1 segment is hypoplastic. Diffusely irregular appearance of the small and medium-sized vessels is felt to be  artifactual related to image noise and mildly suboptimal arterial opacification, however this does preclude assessment for branch vessel stenosis. No aneurysm is identified. Posterior circulation: The intracranial vertebral arteries are widely patent to the basilar. Patent  right PICA, left AICA, and bilateral SCA origins are visualized. There is a fetal origin of the left PCA. Both PCAs are patent without evidence of significant proximal stenosis. No aneurysm is identified. Venous sinuses: Patent. Anatomic variants: Fetal left PCA. Review of the MIP images confirms the above findings IMPRESSION: 1. Widely patent cervical carotid and vertebral arteries. 2. No intracranial large vessel occlusion or flow limiting proximal stenosis. Limited assessment of the small to medium-sized vessels. Electronically Signed   By: Sebastian AcheAllen  Grady M.D.   On: 01/30/2019 20:18   Ct Code Stroke Cta Neck W/wo Contrast  Result Date: 01/30/2019 CLINICAL DATA:  Left-sided numbness and weakness.  Slurred speech. EXAM: CT ANGIOGRAPHY HEAD AND NECK TECHNIQUE: Multidetector CT imaging of the head and neck was performed using the standard protocol during bolus administration of intravenous contrast. Multiplanar CT image reconstructions and MIPs were obtained to evaluate the vascular anatomy. Carotid stenosis measurements (when applicable) are obtained utilizing NASCET criteria, using the distal internal carotid diameter as the denominator. CONTRAST:  125mL OMNIPAQUE IOHEXOL 350 MG/ML SOLN COMPARISON:  None. FINDINGS: CTA NECK FINDINGS Aortic arch: Standard 3 vessel aortic arch with widely patent arch vessel origins. Right carotid system: Patent and smooth without evidence of stenosis or dissection. Left carotid system: Patent and smooth without evidence of stenosis or dissection. Vertebral arteries: Patent and codominant without evidence of stenosis or dissection. Skeleton: Mild cervical spondylosis. Other neck: No evidence of cervical  lymphadenopathy or mass. Upper chest: Reported separately. Review of the MIP images confirms the above findings CTA HEAD FINDINGS Anterior circulation: The internal carotid arteries are widely patent from skull base to carotid termini. ACAs and MCAs are patent without evidence of proximal branch occlusion or flow limiting M1 stenosis. The left A1 segment is hypoplastic. Diffusely irregular appearance of the small and medium-sized vessels is felt to be artifactual related to image noise and mildly suboptimal arterial opacification, however this does preclude assessment for branch vessel stenosis. No aneurysm is identified. Posterior circulation: The intracranial vertebral arteries are widely patent to the basilar. Patent right PICA, left AICA, and bilateral SCA origins are visualized. There is a fetal origin of the left PCA. Both PCAs are patent without evidence of significant proximal stenosis. No aneurysm is identified. Venous sinuses: Patent. Anatomic variants: Fetal left PCA. Review of the MIP images confirms the above findings IMPRESSION: 1. Widely patent cervical carotid and vertebral arteries. 2. No intracranial large vessel occlusion or flow limiting proximal stenosis. Limited assessment of the small to medium-sized vessels. Electronically Signed   By: Sebastian AcheAllen  Grady M.D.   On: 01/30/2019 20:18   [place] Venous Img Lower Bilateral  Result Date: 01/31/2019 CLINICAL DATA:  38 year old female with bilateral lower extremity edema. EXAM: BILATERAL LOWER EXTREMITY VENOUS DOPPLER ULTRASOUND TECHNIQUE: Gray-scale sonography with graded compression, as well as color Doppler and duplex ultrasound were performed to evaluate the lower extremity deep venous systems from the level of the common femoral vein and including the common femoral, femoral, profunda femoral, popliteal and calf veins including the posterior tibial, peroneal and gastrocnemius veins when visible. The superficial great saphenous vein was also  interrogated. Spectral Doppler was utilized to evaluate flow at rest and with distal augmentation maneuvers in the common femoral, femoral and popliteal veins. COMPARISON:  None. FINDINGS: RIGHT LOWER EXTREMITY Common Femoral Vein: No evidence of thrombus. Normal compressibility, respiratory phasicity and response to augmentation. Saphenofemoral Junction: No evidence of thrombus. Normal compressibility and flow on color Doppler imaging. Profunda Femoral Vein: No evidence of  thrombus. Normal compressibility and flow on color Doppler imaging. Femoral Vein: No evidence of thrombus. Normal compressibility, respiratory phasicity and response to augmentation. Popliteal Vein: No evidence of thrombus. Normal compressibility, respiratory phasicity and response to augmentation. Calf Veins: No evidence of thrombus. Normal compressibility and flow on color Doppler imaging. Superficial Great Saphenous Vein: No evidence of thrombus. Normal compressibility. Venous Reflux:  None. Other Findings:  None. LEFT LOWER EXTREMITY Common Femoral Vein: No evidence of thrombus. Normal compressibility, respiratory phasicity and response to augmentation. Saphenofemoral Junction: No evidence of thrombus. Normal compressibility and flow on color Doppler imaging. Profunda Femoral Vein: No evidence of thrombus. Normal compressibility and flow on color Doppler imaging. Femoral Vein: No evidence of thrombus. Normal compressibility, respiratory phasicity and response to augmentation. Popliteal Vein: No evidence of thrombus. Normal compressibility, respiratory phasicity and response to augmentation. Calf Veins: No evidence of thrombus. Normal compressibility and flow on color Doppler imaging. Superficial Great Saphenous Vein: No evidence of thrombus. Normal compressibility. Venous Reflux:  None. Other Findings:  None. IMPRESSION: No evidence of deep venous thrombosis in either lower extremity. Electronically Signed   By: Elgie Collard M.D.   On:  01/31/2019 00:29   Ct Angio Chest/abd/pel For Dissection W And/or Wo Contrast  Result Date: 01/30/2019 CLINICAL DATA:  Left-sided weakness and numbness today. Unstable gait. EXAM: CT ANGIOGRAPHY CHEST, ABDOMEN AND PELVIS TECHNIQUE: Multidetector CT imaging through the chest, abdomen and pelvis was performed using the standard protocol during bolus administration of intravenous contrast. Multiplanar reconstructed images and MIPs were obtained and reviewed to evaluate the vascular anatomy. Pre contrast images were also obtained through the chest. CONTRAST:  OMNIPAQUE IOHEXOL 350 MG/ML SOLN COMPARISON:  None. FINDINGS: CTA CHEST FINDINGS Cardiovascular: Heart is normal size. Thoracic aorta is normal in caliber without dissection or aneurysm. Remaining vascular structures are unremarkable. Mediastinum/Nodes: No mediastinal or hilar adenopathy. Remaining mediastinal structures are normal. Lungs/Pleura: Lungs are well inflated without focal airspace consolidation or effusion. Airways are normal. Musculoskeletal: Unremarkable. Review of the MIP images confirms the above findings. CTA ABDOMEN AND PELVIS FINDINGS VASCULAR Aorta: Normal caliber aorta without aneurysm, dissection, vasculitis or significant stenosis. Celiac: Patent without evidence of aneurysm, dissection, vasculitis or significant stenosis. SMA: Patent without evidence of aneurysm, dissection, vasculitis or significant stenosis. Renals: Both renal arteries are patent without evidence of aneurysm, dissection, vasculitis, fibromuscular dysplasia or significant stenosis. IMA: Patent without evidence of aneurysm, dissection, vasculitis or significant stenosis. Inflow: Patent without evidence of aneurysm, dissection, vasculitis or significant stenosis. Veins: No obvious venous abnormality within the limitations of this arterial phase study. Review of the MIP images confirms the above findings. NON-VASCULAR Hepatobiliary: Gallbladder is contracted. Liver  and biliary tree are normal. Pancreas: Normal. Spleen: Normal. Adrenals/Urinary Tract: Adrenal glands are normal. Kidneys are normal in size without hydronephrosis or nephrolithiasis. Ureters and bladder are normal. Stomach/Bowel: Stomach and small bowel are normal. Appendix is normal. Mild diverticulosis of the colon which is otherwise normal. Lymphatic: No adenopathy. Reproductive: Uterus is normal. Ovaries are high in position but otherwise unremarkable. Other: No free fluid or focal inflammatory change. Musculoskeletal: Unremarkable. Review of the MIP images confirms the above findings. IMPRESSION: Normal thoracoabdominal aorta without aneurysm or dissection. No acute findings in the chest, abdomen or pelvis. Minimal diverticulosis of the colon. Electronically Signed   By: Elberta Fortis M.D.   On: 01/30/2019 19:56   Ct Head Code Stroke Wo Contrast  Result Date: 01/30/2019 CLINICAL DATA:  Code stroke.  Weakness and confusion EXAM: CT HEAD WITHOUT  CONTRAST TECHNIQUE: Contiguous axial images were obtained from the base of the skull through the vertex without intravenous contrast. COMPARISON:  02/18/2014 FINDINGS: Brain: There is no mass, hemorrhage or extra-axial collection. The size and configuration of the ventricles and extra-axial CSF spaces are normal. The brain parenchyma is normal, without evidence of acute or chronic infarction. Vascular: No abnormal hyperdensity of the major intracranial arteries or dural venous sinuses. No intracranial atherosclerosis. Skull: The visualized skull base, calvarium and extracranial soft tissues are normal. Sinuses/Orbits: No fluid levels or advanced mucosal thickening of the visualized paranasal sinuses. No mastoid or middle ear effusion. The orbits are normal. ASPECTS [facility]([place] Stroke Program Early CT Score) - Ganglionic level infarction (caudate, lentiform nuclei, internal capsule, insula, M1-M3 cortex): 7 - Supraganglionic infarction (M4-M6 cortex): 3 Total score (0-10  with 10 being normal): 10 IMPRESSION: 1. Normal head CT. 2. ASPECTS is 10. These results were called by telephone at the time of interpretation on 01/30/2019 at 7:16 pm to Dr. Artis DelayMARY FUNKE , who verbally acknowledged these results. Electronically Signed   By: Deatra RobinsonKevin  Herman M.D.   On: 01/30/2019 19:16    Pending Labs Unresulted Labs (From admission, onward)    Start     Ordered   01/31/19 0500  Hemoglobin A1c  Tomorrow morning,   STAT     01/31/19 0037   01/31/19 0500  Lipid panel  Tomorrow morning,   STAT    Comments: Fasting    01/31/19 0037   01/31/19 0038  HIV antibody (Routine Testing)  Once,   STAT     01/31/19 0037   01/30/19 2248  Urine Drug Screen, Qualitative ([facility] only)  Once,   STAT     01/30/19 2247          Vitals/Pain Today's Vitals   01/30/19 2230 01/30/19 2300 01/30/19 2330 01/31/19 0000  BP: (!) 141/74 (!) 145/81 (!) 143/83 (!) 143/83  Pulse: 77 75 84 80  Resp: 14 20 16 17   Temp:      TempSrc:      SpO2: 99% 98% 99% 99%  Weight:      Height:      PainSc:        Isolation Precautions No active isolations  Medications Medications  sodium chloride flush (NS) 0.9 % injection 3 mL (3 mLs Intravenous Not Given 01/30/19 1900)  FLUoxetine (PROZAC) capsule 20 mg (has no administration in time range)  lamoTRIgine (LAMICTAL) tablet 25 mg (has no administration in time range)   stroke: mapping our early stages of recovery book (has no administration in time range)  0.9 %  sodium chloride infusion (has no administration in time range)  acetaminophen (TYLENOL) tablet 650 mg (has no administration in time range)    Or  acetaminophen (TYLENOL) solution 650 mg (has no administration in time range)    Or  acetaminophen (TYLENOL) suppository 650 mg (has no administration in time range)  senna-docusate (Senokot-S) tablet 1 tablet (has no administration in time range)  aspirin EC tablet 81 mg (has no administration in time range)  alteplase (ACTIVASE) 1 mg/mL infusion 90  mg (0 mg Intravenous Stopped 01/30/19 2120)    Followed by  0.9 %  sodium chloride infusion (0 mLs Intravenous Stopped 01/30/19 2121)  iohexol (OMNIPAQUE) 350 MG/ML injection 125 mL (125 mLs Intravenous Contrast Given 01/30/19 1927)    Mobility walks High fall risk   Focused Assessments Neuro Assessment Handoff:  Swallow screen pass? Yes  Cardiac Rhythm: (sinus, multifocal pvcs frequently) NIH  Stroke Scale ( + Modified Stroke Scale Criteria)  Interval: Other (Comment)(30 minute check) Level of Consciousness (1a.)   : Alert, keenly responsive LOC Questions (1b. )   +: Answers both questions correctly LOC Commands (1c. )   + : Performs both tasks correctly Best Gaze (2. )  +: Normal Visual (3. )  +: No visual loss Facial Palsy (4. )    : Normal symmetrical movements Motor Arm, Left (5a. )   +: No drift Motor Arm, Right (5b. )   +: No drift Motor Leg, Left (6a. )   +: No drift Motor Leg, Right (6b. )   +: No drift Limb Ataxia (7. ): Absent Sensory (8. )   +: Normal, no sensory loss Best Language (9. )   +: No aphasia Dysarthria (10. ): Normal Extinction/Inattention (11.)   +: No Abnormality Modified SS Total  +: 0 Complete NIHSS TOTAL: 0 Last date known well: 01/30/19 Last time known well: 1700 Neuro Assessment:   Neuro Checks:   Initial(Started at 1907, then patient went to CT, completed approximately 1945.) (01/30/19 1907)  Last Documented NIHSS Modified Score: 0 (01/31/19 0000) Has TPA been given? Yes Temp: 98.3 F (36.8 C) (07/19 2045) Temp Source: Oral (07/19 2015) BP: 143/83 (07/20 0000) Pulse Rate: 80 (07/20 0000) If patient is a Neuro Trauma and patient is going to OR before floor call report to 4N Charge nurse: [phone] or [phone]     R Recommendations: See Admitting Provider Note  Report given to:   Additional Notes:  No additional blood thinners for 24 hours post TPA

## 2019-01-31 NOTE — Consult Note (Signed)
Neurology Consult  Referring Physician: Dr. Allena KatzPatel, Sona  Chief Complaint/Reason for Consult:  I have been asked to see the patient in neurological consultation to render advice and opinion regarding left hemibody weakness / left arm pain s/p tPA.  HPI: Ms. Cynthia Howard is a 38 y.o.  female with a history significant for tobacco abuse, Bipolar I and DM who presented last night with left arm pain and left hemibody weakness s/p tPA. History provided by the patient and supplemented by chart review.  Around 16:00 on 01/30/2019, patient reportedly had left leg pain and left chest pain. This was followed by left arm numbness and pain and subsequently left facial drooping. The numbness progressed and involved her left leg as well. Patient presented to the hospital and was evaluated by teleneurology. NIHSS 8. Patient was deemed a candidate for tPA. tPA was administered at 01/30/2019 20:04:26. It was reportedly that shortly after, patient's symptoms resolved.   Around 22:00, patient experienced another episode of numbness with inability to move her left upper or lower extremity, which reportedly lasted for about 30 seconds. Teleneurology was re-consulted - "However she does have some atypical features such as she gets significant pain in her left forearm when she gets the weakness, a MRI of the C-spine could be added to her workup to make sure she does not have a compressive myeloradiculopathy although she does have atypical features for that as well."   In the ED, vitals were ranging . Labs are WBC 11.7, Cr 0.65, BUN 11. HCT w/o contrast revealed demonstrated no hemorrhage. MRI brain w/o demonstrated no acute infarct. All images independently visualized.  This morning, the patient states that she feels better. She still complains of mild left face and left hemibody numbness. No weakness. She does complain of a mild headache that was present since last night. She typically does not get headaches.  She complains of mild chest pain as well, which she reports is chronic for her.    Past Medical History:  Diagnosis Date  . Asthma   . Bipolar 1 disorder, mixed (HCC) 1996  . Diabetes mellitus without complication (HCC)    Diagnosed 2 weeks ago  . Mitral prolapse 1988    Past Surgical History:  Procedure Laterality Date  . CESAREAN SECTION     X5  . TUBAL LIGATION Bilateral 12/22/2011   Allergies  Allergen Reactions  . Abilify [Aripiprazole] Shortness Of Breath  . Topamax [Topiramate] Rash   Prior to Admission medications   Medication Sig Start Date End Date Taking? Authorizing Provider  FLUoxetine (PROZAC) 20 MG capsule Take 20 mg by mouth daily.   Yes [provider]  lamoTRIgine (LAMICTAL) 25 MG tablet Take 25 mg by mouth 2 (two) times daily.   Yes [provider]   Family History  Problem Relation Age of Onset  . Diabetes Father    Relationships  Social connections  . Talks on phone: Not on file  . Gets together: Not on file  . Attends religious service: Not on file  . Active member of club or organization: Not on file  . Attends meetings of clubs or organizations: Not on file  . Relationship status: Not on file      Continuous Infusions: . sodium chloride 10 mL/hr at 01/31/19 0500   Scheduled Meds:  .  stroke: mapping our early stages of recovery book   Does not apply Once  . [START ON 02/02/2019] aspirin EC  81 mg Oral Daily  . atorvastatin  80 mg Oral q1800  . FLUoxetine  20 mg Oral Daily  . insulin aspart  0-15 Units Subcutaneous Q4H  . lamoTRIgine  25 mg Oral BID  . sodium chloride flush  3 mL Intravenous Once   PRN Meds: acetaminophen **OR** acetaminophen (TYLENOL) oral liquid 160 mg/5 mL **OR** acetaminophen, ipratropium-albuterol, senna-docusate   Review of Systems A comprehensive review of systems was negative.   Objective: Temp:  [97.7 F (36.5 C)-98.6 F (37 C)] 98.4 F (36.9 C) (07/20 0200) Pulse Rate:  [66-89] 66 (07/20  0600) Resp:  [14-26] 15 (07/20 0600) BP: (113-157)/(62-96) 120/76 (07/20 0600) SpO2:  [93 %-99 %] 95 % (07/20 0600) Weight:  [103.4 kg-108.7 kg] 103.4 kg (07/20 0200) @FLOW24H (409811)@(301030)@ @FLOW24H (914782)@(250026)@   Intake/Output Summary (Last 24 hours) at 01/31/2019 0903 Last data filed at 01/31/2019 0500 Gross per 24 hour  Intake 123.3 ml  Output -  Net 123.3 ml    Wt Readings from Last 3 Encounters:  01/31/19 103.4 kg      Physical Exam:  General: well nourished, well hydrated, no acute distress  ENT: Oropharynx clear. No lesions or exudate.  Cardiovascular: Regular rate and rhythm.  Respiratory: Clear to auscultation bilaterally.  Gastrointestinal: Abdomen soft, non-tender, nondistended  Skin: No rashes, lesions, or ulcerations, no subcutaneous nodules or induration     Mental Status: Orientation: Alert and oriented to person, place, and time. Memory: Cooperative, follows commands well. Recent and remote memory normal. Attention, Concentration: Attention span and concentration are normal. Language: Speech is clear and language is normal. Fund of Knowledge: Aware of current events, vocabulary appropriate for patient age.  Cranial Nerves   II Visual Fields: Intact to confrontation   III, IV, VI: Pupils equal and reactive to light and near. Full eye movement without nystagmus  V Facial Sensation: Decreased on the left face (around V2 distribution). No weakness of masticatory muscles  VII: No facial weakness or asymmetry  VIII Auditory Acuity: Grossly normal  IX/X: The uvula is midline; the palate elevates symmetrically  XI: Normal sternocleidomastoid and trapezius strength  XII: The tongue is midline. No atrophy or fasciculations.     Motor System: Muscle Strength: 5/5 and symmetric in the upper and lower extremities. No pronation or drift. Muscle Tone: Tone and muscle bulk are normal in the upper and lower extremities.  Reflexes: DTRs: 2+ and symmetrical in all four extremities.  Plantar responses are flexor bilaterally.  Coordination: Intact finger-to-nose, heel-to-shin, and rapid alternating movements. No tremor.  Sensation: Decreased to light touch in the left hemibody  Gait: Gait deferred for tPA protocol   Other:     Labs: Recent Labs  Lab 01/30/19 1857  WBC 11.7*  HGB 12.9  HCT 37.6  PLT 268  MCV 93.3   Recent Labs  Lab 01/31/19 0308  CHOL 194  HDL 42  TRIG 90  LDL 134 Recent Labs  Lab 01/30/19 1857 01/31/19 0308  NA 138 135  K 3.7 3.6  CL 105 104  CO2 25 25  BUN 12 11  CREATININE 0.74 0.65  CALCIUM 8.8* 8.3*  MG  --  2.0  PHOS  --  4.2      Significant Diagnostic Studies: All images independently visualized.    Impression: Ms. Cynthia LackCaren Elizabeth Howard is a 38 y.o.  female with a history significant for tobacco abuse, Bipolar I and DM who presented last night with left arm pain and left hemibody weakness s/p tPA. MRI brain w/o demonstrated no acute infarct; despite patient continuing to  have mild left sided symptoms. At this time, since she continues to have symptoms, unlikely to be a TIA or a stroke given unremarkable MRI. Other consideration could be an MRI negative stroke; though unlikely. Risk factors include: DM and tobacco use. There was some concerns for functional component to exam last night. This morning, patient is giving good effort but still has subjective left hemibody (including face) numbness.   Recommendations: - Patient already got a CTA head/neck that demonstrated no carotid stenosis or LVO. Does not need carotid ultrasound - Can proceed with TTE w/ bubble study for completeness sake  - Can transfer out of the ICU close to the 24 hour mark; patient is progressing well. Should at least get a non-contrasted HCT prior to transfer to make sure there are no hemorrhage - OK for a diet  - Can start ASA 81 mg daily and Lipitor 40 mg daily if repeat non-contrasted HCT demonstrates no hemorrhage.  - Recommend PT/OT once  transferred to floor  - Please call Neurology for any further questions or concerns    It has been a pleasure to participate in the care of this patient.  Best Regards,

## 2019-01-31 NOTE — H&P (Signed)
[employer] -  at [facility]   PATIENT NAME: Cynthia GuysCaren Howard    MR#:  [id number]  DATE OF BIRTH:  11-25-1980  DATE OF ADMISSION:  01/30/2019  PRIMARY CARE PHYSICIAN: [facility]   REQUESTING/REFERRING PHYSICIAN: Artis DelayMary Funke, MD  CHIEF COMPLAINT:   Chief Complaint  Patient presents with   Code Stroke    HISTORY OF PRESENT ILLNESS:  Cynthia Howard  is a 38 y.o. female with a known history of bipolar 1 disorder, diabetes mellitus, mitral valve prolapse, and asthma.  She presented to the emergency room reporting strokelike symptoms.  She reports noticing edema and pain of her left leg around 1600 which was followed by left chest pain which she described as pressure with a pain score 8 out of 10 which began around 1630.  She then experienced left arm numbness with sharp pain which was followed by left facial drooping with numbness of her left upper and lower extremity.  She was also experiencing expressive aphasia according to her husband.  Code stroke was initiated on her arrival to the emergency room and patient received TPA therapy.  At 1800 patient experienced another episode of numbness with inability to move her left upper or lower extremity lasting approximately 30 seconds.  Suggestion for MRI of the C-spine for further work-up to rule out compressive myeloradiculopathy.  Patient denies experiencing fever, chills, nausea, vomiting, diarrhea, dizziness, diplopia, blurred vision.  She denies dysphagia.  She denies a prior history of stroke.  However she admits to a possible history of MI at age 38 years old however also reports this was thought at the time to possibly be a reaction to Thorazine.  Tele-neurology was consulted at the time of initial stroke presentation with TPA administration.  Recommendations included repeated CT brain in 24 hours with a swallowing evaluation and telemetry monitoring.  She has been admitted by the  hospitalist service with admission to the ICU and transfer of care discussed with the on-call intensivist.  PAST MEDICAL HISTORY:   Past Medical History:  Diagnosis Date   Asthma    Bipolar 1 disorder, mixed (HCC) 1996   Diabetes mellitus without complication (HCC)    Diagnosed 2 weeks ago   Mitral prolapse 1988    PAST SURGICAL HISTORY:   Past Surgical History:  Procedure Laterality Date   CESAREAN SECTION     X5   TUBAL LIGATION Bilateral 12/22/2011    SOCIAL HISTORY:   Social History   Tobacco Use   Smoking status: Current Every Day Smoker    Packs/day: 2.00    Years: 25.00    Pack years: 50.00    Types: Cigarettes   Smokeless tobacco: Never Used   Tobacco comment: "I wanna quit so bad."  Substance Use Topics   Alcohol use: Not Currently    FAMILY HISTORY:   Family History  Problem Relation Age of Onset   Diabetes Father     DRUG ALLERGIES:   Allergies  Allergen Reactions   Abilify [Aripiprazole] Shortness Of Breath   Topamax [Topiramate] Rash    REVIEW OF SYSTEMS:   Review of Systems  Constitutional: Negative for chills, fever and malaise/fatigue.  HENT: Negative for congestion, hearing loss, nosebleeds, sinus pain, sore throat and tinnitus.   Eyes: Negative for blurred vision, double vision and pain.  Respiratory: Negative for cough, hemoptysis, shortness of breath and wheezing.   Cardiovascular: Positive for chest pain and leg swelling (trace left lower extremity). Negative for palpitations and  orthopnea.  Gastrointestinal: Negative for abdominal pain, blood in stool, constipation, diarrhea, heartburn, melena, nausea and vomiting.  Genitourinary: Negative for dysuria, flank pain, frequency, hematuria and urgency.  Musculoskeletal: Negative for falls, joint pain, myalgias and neck pain.  Skin: Negative for itching and rash.  Neurological: Positive for speech change, focal weakness (Left upper and lower extremity- transient) and  headaches. Negative for dizziness, tingling, tremors, seizures and loss of consciousness.  Psychiatric/Behavioral: Positive for depression. Negative for substance abuse.    MEDICATIONS AT HOME:   Prior to Admission medications   Medication Sig Start Date End Date Taking? Authorizing Provider  FLUoxetine (PROZAC) 20 MG capsule Take 20 mg by mouth daily.   Yes [provider]  lamoTRIgine (LAMICTAL) 25 MG tablet Take 25 mg by mouth 2 (two) times daily.   Yes [provider]      VITAL SIGNS:  Blood pressure (!) 143/83, pulse 80, temperature 98.3 F (36.8 C), resp. rate 17, height 5\' 4"  (1.626 m), weight 108.7 kg, last menstrual period 01/09/2019, SpO2 99 %.  PHYSICAL EXAMINATION:  Physical Exam  GENERAL:  38 y.o.-year-old patient lying in the bed with no acute distress.  EYES: Pupils equal, round, reactive to light and accommodation. No scleral icterus. Extraocular muscles intact.  HEENT: Head atraumatic, normocephalic. Oropharynx and nasopharynx clear.  NECK:  Supple, no jugular venous distention. No thyroid enlargement, no tenderness.  LUNGS: Normal breath sounds bilaterally, no wheezing, rales,rhonchi or crepitation. No use of accessory muscles of respiration.  CARDIOVASCULAR: Regular rate and rhythm, S1, S2 normal. No murmurs, rubs, or gallops.  ABDOMEN: Soft, nondistended, nontender. Bowel sounds present. No organomegaly or mass.  EXTREMITIES:Trace left lower extremity pedal edema, cyanosis, or clubbing.  NEUROLOGIC:Mild left facial droop. Muscle strength 5/5 in all extremities. Sensation intact. Gait not checked.  PSYCHIATRIC: The patient is alert and oriented x 3.  Normal affect and good eye contact. SKIN: No obvious rash, lesion, or ulcer.   LABORATORY PANEL:   CBC Recent Labs  Lab 01/30/19 1857  WBC 11.7*  HGB 12.9  HCT 37.6  PLT 268    ------------------------------------------------------------------------------------------------------------------  Chemistries  Recent Labs  Lab 01/30/19 1857  NA 138  K 3.7  CL 105  CO2 25  GLUCOSE 113*  BUN 12  CREATININE 0.74  CALCIUM 8.8*  AST 15  ALT 21  ALKPHOS 81  BILITOT 0.2*   ------------------------------------------------------------------------------------------------------------------  Cardiac Enzymes No results for input(s): TROPONINI in the last 168 hours. ------------------------------------------------------------------------------------------------------------------  RADIOLOGY:  Ct Code Stroke Cta Head W/wo Contrast  Result Date: 01/30/2019 CLINICAL DATA:  Left-sided numbness and weakness.  Slurred speech. EXAM: CT ANGIOGRAPHY HEAD AND NECK TECHNIQUE: Multidetector CT imaging of the head and neck was performed using the standard protocol during bolus administration of intravenous contrast. Multiplanar CT image reconstructions and MIPs were obtained to evaluate the vascular anatomy. Carotid stenosis measurements (when applicable) are obtained utilizing NASCET criteria, using the distal internal carotid diameter as the denominator. CONTRAST:  180mL OMNIPAQUE IOHEXOL 350 MG/ML SOLN COMPARISON:  None. FINDINGS: CTA NECK FINDINGS Aortic arch: Standard 3 vessel aortic arch with widely patent arch vessel origins. Right carotid system: Patent and smooth without evidence of stenosis or dissection. Left carotid system: Patent and smooth without evidence of stenosis or dissection. Vertebral arteries: Patent and codominant without evidence of stenosis or dissection. Skeleton: Mild cervical spondylosis. Other neck: No evidence of cervical lymphadenopathy or mass. Upper chest: Reported separately. Review of the MIP images confirms the above findings CTA HEAD FINDINGS Anterior  circulation: The internal carotid arteries are widely patent from skull base to carotid termini. ACAs  and MCAs are patent without evidence of proximal branch occlusion or flow limiting M1 stenosis. The left A1 segment is hypoplastic. Diffusely irregular appearance of the small and medium-sized vessels is felt to be artifactual related to image noise and mildly suboptimal arterial opacification, however this does preclude assessment for branch vessel stenosis. No aneurysm is identified. Posterior circulation: The intracranial vertebral arteries are widely patent to the basilar. Patent right PICA, left AICA, and bilateral SCA origins are visualized. There is a fetal origin of the left PCA. Both PCAs are patent without evidence of significant proximal stenosis. No aneurysm is identified. Venous sinuses: Patent. Anatomic variants: Fetal left PCA. Review of the MIP images confirms the above findings IMPRESSION: 1. Widely patent cervical carotid and vertebral arteries. 2. No intracranial large vessel occlusion or flow limiting proximal stenosis. Limited assessment of the small to medium-sized vessels. Electronically Signed   By: Sebastian Ache M.D.   On: 01/30/2019 20:18   Ct Code Stroke Cta Neck W/wo Contrast  Result Date: 01/30/2019 CLINICAL DATA:  Left-sided numbness and weakness.  Slurred speech. EXAM: CT ANGIOGRAPHY HEAD AND NECK TECHNIQUE: Multidetector CT imaging of the head and neck was performed using the standard protocol during bolus administration of intravenous contrast. Multiplanar CT image reconstructions and MIPs were obtained to evaluate the vascular anatomy. Carotid stenosis measurements (when applicable) are obtained utilizing NASCET criteria, using the distal internal carotid diameter as the denominator. CONTRAST:  OMNIPAQUE IOHEXOL 350 MG/ML SOLN COMPARISON:  None. FINDINGS: CTA NECK FINDINGS Aortic arch: Standard 3 vessel aortic arch with widely patent arch vessel origins. Right carotid system: Patent and smooth without evidence of stenosis or dissection. Left carotid system: Patent and  smooth without evidence of stenosis or dissection. Vertebral arteries: Patent and codominant without evidence of stenosis or dissection. Skeleton: Mild cervical spondylosis. Other neck: No evidence of cervical lymphadenopathy or mass. Upper chest: Reported separately. Review of the MIP images confirms the above findings CTA HEAD FINDINGS Anterior circulation: The internal carotid arteries are widely patent from skull base to carotid termini. ACAs and MCAs are patent without evidence of proximal branch occlusion or flow limiting M1 stenosis. The left A1 segment is hypoplastic. Diffusely irregular appearance of the small and medium-sized vessels is felt to be artifactual related to image noise and mildly suboptimal arterial opacification, however this does preclude assessment for branch vessel stenosis. No aneurysm is identified. Posterior circulation: The intracranial vertebral arteries are widely patent to the basilar. Patent right PICA, left AICA, and bilateral SCA origins are visualized. There is a fetal origin of the left PCA. Both PCAs are patent without evidence of significant proximal stenosis. No aneurysm is identified. Venous sinuses: Patent. Anatomic variants: Fetal left PCA. Review of the MIP images confirms the above findings IMPRESSION: 1. Widely patent cervical carotid and vertebral arteries. 2. No intracranial large vessel occlusion or flow limiting proximal stenosis. Limited assessment of the small to medium-sized vessels. Electronically Signed   By: Sebastian Ache M.D.   On: 01/30/2019 20:18   Mr Laqueta Jean ZO Contrast  Result Date: 01/31/2019 CLINICAL DATA:  Stroke follow-up EXAM: MRI HEAD WITHOUT AND WITH CONTRAST TECHNIQUE: Multiplanar, multiecho pulse sequences of the brain and surrounding structures were obtained without and with intravenous contrast. CONTRAST:  10 mL Gadavist COMPARISON:  CTA head neck 01/30/2019. FINDINGS: Brain: There is no acute infarct, acute hemorrhage or extra-axial  collection. The midline structures are  normal. There is no midline shift or mass effect. The white matter signal is normal for the patient's age. The cerebral and cerebellar volume are age-appropriate. There is no hydrocephalus. Susceptibility-sensitive sequences show no chronic microhemorrhage or superficial siderosis. There is no abnormal contrast enhancement. Vascular: The major intracranial arterial and venous sinus flow voids are preserved. Skull and upper cervical spine: The visualized skull base, calvarium, upper cervical spine and extracranial soft tissues are normal. Sinuses/Orbits: No fluid levels or advanced mucosal thickening of the visualized paranasal sinuses. No mastoid or middle ear effusion. The orbits are normal. Other: None IMPRESSION: Normal brain. Electronically Signed   By: Deatra RobinsonKevin  Herman M.D.   On: 01/31/2019 01:57   [place] Venous Img Lower Bilateral  Result Date: 01/31/2019 CLINICAL DATA:  38 year old female with bilateral lower extremity edema. EXAM: BILATERAL LOWER EXTREMITY VENOUS DOPPLER ULTRASOUND TECHNIQUE: Gray-scale sonography with graded compression, as well as color Doppler and duplex ultrasound were performed to evaluate the lower extremity deep venous systems from the level of the common femoral vein and including the common femoral, femoral, profunda femoral, popliteal and calf veins including the posterior tibial, peroneal and gastrocnemius veins when visible. The superficial great saphenous vein was also interrogated. Spectral Doppler was utilized to evaluate flow at rest and with distal augmentation maneuvers in the common femoral, femoral and popliteal veins. COMPARISON:  None. FINDINGS: RIGHT LOWER EXTREMITY Common Femoral Vein: No evidence of thrombus. Normal compressibility, respiratory phasicity and response to augmentation. Saphenofemoral Junction: No evidence of thrombus. Normal compressibility and flow on color Doppler imaging. Profunda Femoral Vein: No evidence of  thrombus. Normal compressibility and flow on color Doppler imaging. Femoral Vein: No evidence of thrombus. Normal compressibility, respiratory phasicity and response to augmentation. Popliteal Vein: No evidence of thrombus. Normal compressibility, respiratory phasicity and response to augmentation. Calf Veins: No evidence of thrombus. Normal compressibility and flow on color Doppler imaging. Superficial Great Saphenous Vein: No evidence of thrombus. Normal compressibility. Venous Reflux:  None. Other Findings:  None. LEFT LOWER EXTREMITY Common Femoral Vein: No evidence of thrombus. Normal compressibility, respiratory phasicity and response to augmentation. Saphenofemoral Junction: No evidence of thrombus. Normal compressibility and flow on color Doppler imaging. Profunda Femoral Vein: No evidence of thrombus. Normal compressibility and flow on color Doppler imaging. Femoral Vein: No evidence of thrombus. Normal compressibility, respiratory phasicity and response to augmentation. Popliteal Vein: No evidence of thrombus. Normal compressibility, respiratory phasicity and response to augmentation. Calf Veins: No evidence of thrombus. Normal compressibility and flow on color Doppler imaging. Superficial Great Saphenous Vein: No evidence of thrombus. Normal compressibility. Venous Reflux:  None. Other Findings:  None. IMPRESSION: No evidence of deep venous thrombosis in either lower extremity. Electronically Signed   By: Elgie CollardArash  Radparvar M.D.   On: 01/31/2019 00:29   Ct Angio Chest/abd/pel For Dissection W And/or Wo Contrast  Result Date: 01/30/2019 CLINICAL DATA:  Left-sided weakness and numbness today. Unstable gait. EXAM: CT ANGIOGRAPHY CHEST, ABDOMEN AND PELVIS TECHNIQUE: Multidetector CT imaging through the chest, abdomen and pelvis was performed using the standard protocol during bolus administration of intravenous contrast. Multiplanar reconstructed images and MIPs were obtained and reviewed to evaluate the  vascular anatomy. Pre contrast images were also obtained through the chest. CONTRAST:  125mL OMNIPAQUE IOHEXOL 350 MG/ML SOLN COMPARISON:  None. FINDINGS: CTA CHEST FINDINGS Cardiovascular: Heart is normal size. Thoracic aorta is normal in caliber without dissection or aneurysm. Remaining vascular structures are unremarkable. Mediastinum/Nodes: No mediastinal or hilar adenopathy. Remaining mediastinal structures are normal. Lungs/Pleura: Lungs  are well inflated without focal airspace consolidation or effusion. Airways are normal. Musculoskeletal: Unremarkable. Review of the MIP images confirms the above findings. CTA ABDOMEN AND PELVIS FINDINGS VASCULAR Aorta: Normal caliber aorta without aneurysm, dissection, vasculitis or significant stenosis. Celiac: Patent without evidence of aneurysm, dissection, vasculitis or significant stenosis. SMA: Patent without evidence of aneurysm, dissection, vasculitis or significant stenosis. Renals: Both renal arteries are patent without evidence of aneurysm, dissection, vasculitis, fibromuscular dysplasia or significant stenosis. IMA: Patent without evidence of aneurysm, dissection, vasculitis or significant stenosis. Inflow: Patent without evidence of aneurysm, dissection, vasculitis or significant stenosis. Veins: No obvious venous abnormality within the limitations of this arterial phase study. Review of the MIP images confirms the above findings. NON-VASCULAR Hepatobiliary: Gallbladder is contracted. Liver and biliary tree are normal. Pancreas: Normal. Spleen: Normal. Adrenals/Urinary Tract: Adrenal glands are normal. Kidneys are normal in size without hydronephrosis or nephrolithiasis. Ureters and bladder are normal. Stomach/Bowel: Stomach and small bowel are normal. Appendix is normal. Mild diverticulosis of the colon which is otherwise normal. Lymphatic: No adenopathy. Reproductive: Uterus is normal. Ovaries are high in position but otherwise unremarkable. Other: No free  fluid or focal inflammatory change. Musculoskeletal: Unremarkable. Review of the MIP images confirms the above findings. IMPRESSION: Normal thoracoabdominal aorta without aneurysm or dissection. No acute findings in the chest, abdomen or pelvis. Minimal diverticulosis of the colon. Electronically Signed   By: Elberta Fortisaniel  Boyle M.D.   On: 01/30/2019 19:56   Ct Head Code Stroke Wo Contrast  Result Date: 01/30/2019 CLINICAL DATA:  Code stroke.  Weakness and confusion EXAM: CT HEAD WITHOUT CONTRAST TECHNIQUE: Contiguous axial images were obtained from the base of the skull through the vertex without intravenous contrast. COMPARISON:  02/18/2014 FINDINGS: Brain: There is no mass, hemorrhage or extra-axial collection. The size and configuration of the ventricles and extra-axial CSF spaces are normal. The brain parenchyma is normal, without evidence of acute or chronic infarction. Vascular: No abnormal hyperdensity of the major intracranial arteries or dural venous sinuses. No intracranial atherosclerosis. Skull: The visualized skull base, calvarium and extracranial soft tissues are normal. Sinuses/Orbits: No fluid levels or advanced mucosal thickening of the visualized paranasal sinuses. No mastoid or middle ear effusion. The orbits are normal. ASPECTS [facility]([place] Stroke Program Early CT Score) - Ganglionic level infarction (caudate, lentiform nuclei, internal capsule, insula, M1-M3 cortex): 7 - Supraganglionic infarction (M4-M6 cortex): 3 Total score (0-10 with 10 being normal): 10 IMPRESSION: 1. Normal head CT. 2. ASPECTS is 10. These results were called by telephone at the time of interpretation on 01/30/2019 at 7:16 pm to Dr. Artis DelayMARY FUNKE , who verbally acknowledged these results. Electronically Signed   By: Deatra RobinsonKevin  Herman M.D.   On: 01/30/2019 19:16      IMPRESSION AND PLAN:   1.  Acute ischemic stroke - Patient is status post administration of TPA therapy with tele-neurology consultation - Continue neurological  checks - Repeat CT brain in 24 hours as recommended - N.p.o. until swallow test - We will begin aspirin therapy on 02/01/2019 - Telemetry monitoring -SCDs in place - In-house neurology has been consulted for further evaluation and recommendations - We will get echocardiogram and carotid Doppler studies - Urine drug screen is pending - We will continue to trend troponin levels - We will get lipid panel - Statin therapy initiated  2.  Diabetes mellitus - Moderate sliding scale insulin -Hemoglobin A1c     3.  Bipolar 1 disorder - Will restart Lamictal and Prozac  4.  Left lower  extremity edema - No evidence of DVT present on venous ultrasound - We will continue to monitor for signs of infection or increased edema  DVT and PPI prophylaxis initiated - Patient admitted by the hospitalist service with transfer of care to ICU intensivist  All the records are reviewed and case discussed with ED provider. The plan of care was discussed in details with the patient (and family). I answered all questions. The patient agreed to proceed with the above mentioned plan. Further management will depend upon hospital course.   CODE STATUS: Full code  TOTAL TIME TAKING CARE OF THIS PATIENT: .    Milas Kocher Ranard Harte CRNP on 01/31/2019 at 2:06 AM  Pager - [phone]  After 6pm go to [email or web address] - Scientist, research (life sciences) [facility]  Office  [phone]  CC: Primary care physician; [facility]   Note: This dictation was prepared with Nurse, children's dictation along with smaller phrase technology. Any transcriptional errors that result from this process are unintentional.

## 2019-02-01 LAB — HIV ANTIBODY (ROUTINE TESTING W REFLEX): HIV Screen 4th Generation wRfx: NONREACTIVE

## 2021-02-19 ENCOUNTER — Emergency Department
Admission: EM | Admit: 2021-02-19 | Discharge: 2021-02-19 | Disposition: A | Discharge status: Home / Self Care | Payer: Self-pay | Attending: Emergency Medicine | Admitting: Emergency Medicine

## 2021-02-19 ENCOUNTER — Emergency Department: Payer: Self-pay

## 2021-02-19 ENCOUNTER — Other Ambulatory Visit: Payer: Self-pay

## 2021-02-19 DIAGNOSIS — R0789 Other chest pain: Secondary | ICD-10-CM | POA: Insufficient documentation

## 2021-02-19 DIAGNOSIS — Z5321 Procedure and treatment not carried out due to patient leaving prior to being seen by health care provider: Secondary | ICD-10-CM | POA: Insufficient documentation

## 2021-02-19 DIAGNOSIS — R209 Unspecified disturbances of skin sensation: Secondary | ICD-10-CM | POA: Insufficient documentation

## 2021-02-19 LAB — CBC
HCT: 38.9 % (ref 36.0–46.0)
Hemoglobin: 13.9 g/dL (ref 12.0–15.0)
MCH: 32.4 pg (ref 26.0–34.0)
MCHC: 35.7 g/dL (ref 30.0–36.0)
MCV: 90.7 fL (ref 80.0–100.0)
Platelets: 326 10*3/uL (ref 150–400)
RBC: 4.29 MIL/uL (ref 3.87–5.11)
RDW: 12.2 % (ref 11.5–15.5)
WBC: 10.7 10*3/uL — ABNORMAL HIGH (ref 4.0–10.5)
nRBC: 0 % (ref 0.0–0.2)

## 2021-02-19 LAB — BASIC METABOLIC PANEL
Anion gap: 9 (ref 5–15)
BUN: 10 mg/dL (ref 6–20)
CO2: 24 mmol/L (ref 22–32)
Calcium: 9.2 mg/dL (ref 8.9–10.3)
Chloride: 102 mmol/L (ref 98–111)
Creatinine, Ser: 0.65 mg/dL (ref 0.44–1.00)
GFR, Estimated: >60 mL/min (ref >60)
Glucose, Bld: 95 mg/dL (ref 70–99)
Potassium: 3.8 mmol/L (ref 3.5–5.1)
Sodium: 135 mmol/L (ref 135–145)

## 2021-02-19 LAB — TROPONIN I (HIGH SENSITIVITY): Troponin I (High Sensitivity): 5 ng/L (ref <18)

## 2021-02-19 NOTE — ED Triage Notes (Signed)
Patient states that she had a stroke on Thursday and was seen at [facility]. Pt reports that she has had L sided CP since noon. No meds PTA. Pt endorses L arm numbness but is unsure whether this is new or residual from a recent CVA.

## 2021-02-25 IMAGING — CT CT HEAD WITHOUT CONTRAST
3 series · 15 of 47 positions shown, 18 images · non-contrast
Comparison: 01/30/2019

CLINICAL DATA: Status post tPA administration, follow-up exam

EXAM:
CT HEAD WITHOUT CONTRAST
TECHNIQUE: Contiguous axial images were obtained from the base of the skull
through the vertex without intravenous contrast.

[Series 2: head wo · axial · 0.47mm/px · z∈[+664,+789]mm · 9 of 30 slices shown, 12 images]
[im 3/30  brain]
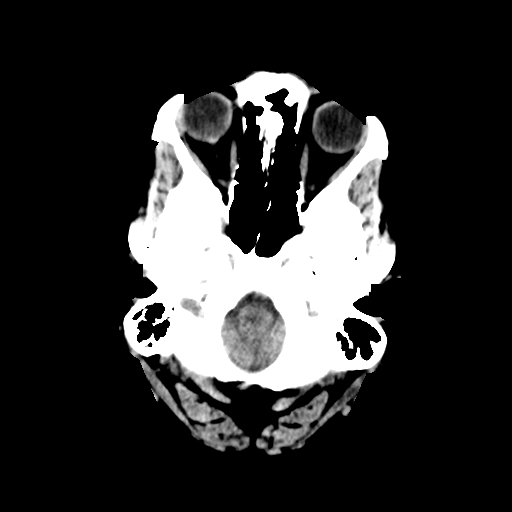
[im 3/30  bone]
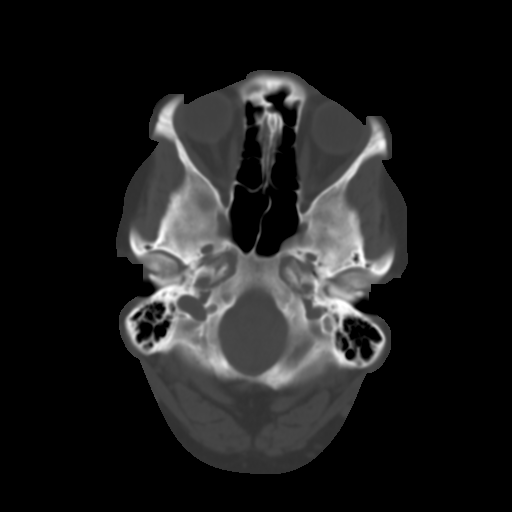
[im 6/30  brain]
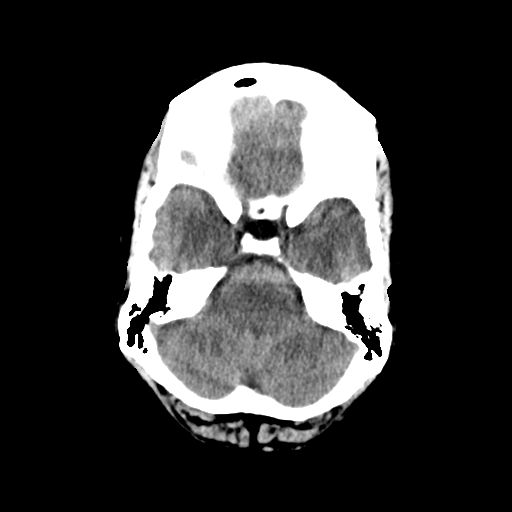
[im 9/30  brain]
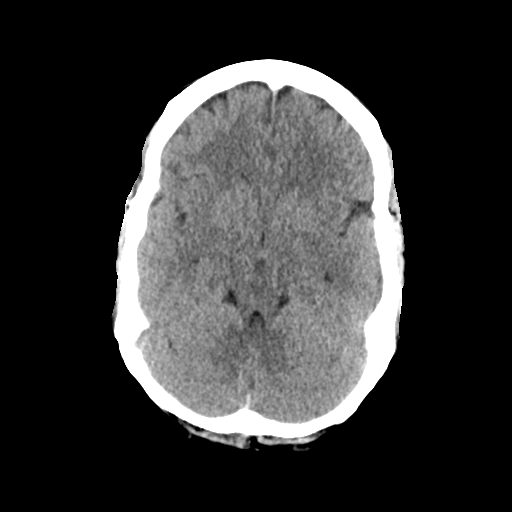
[im 12/30  brain]
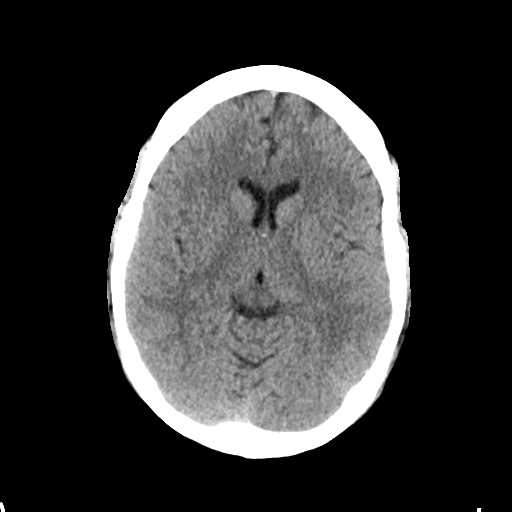
[im 16/30  brain]
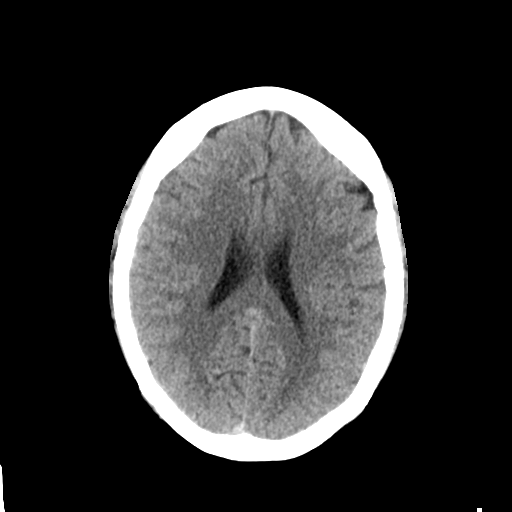
[im 16/30  bone]
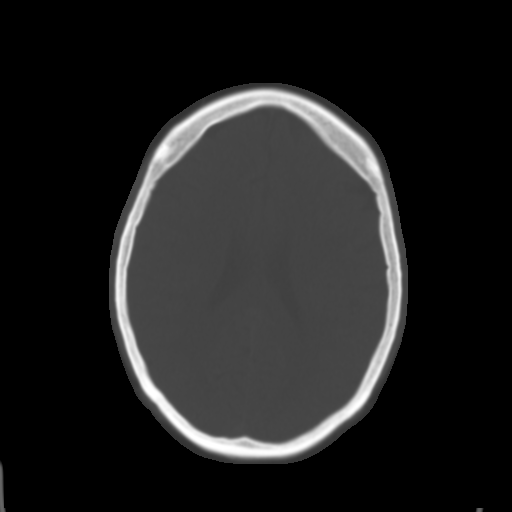
[im 19/30  brain]
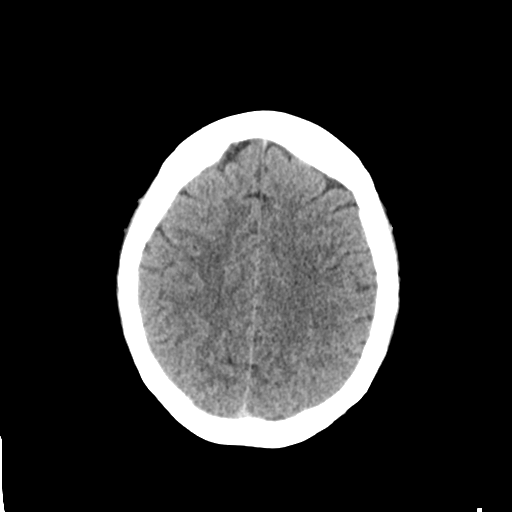
[im 22/30  brain]
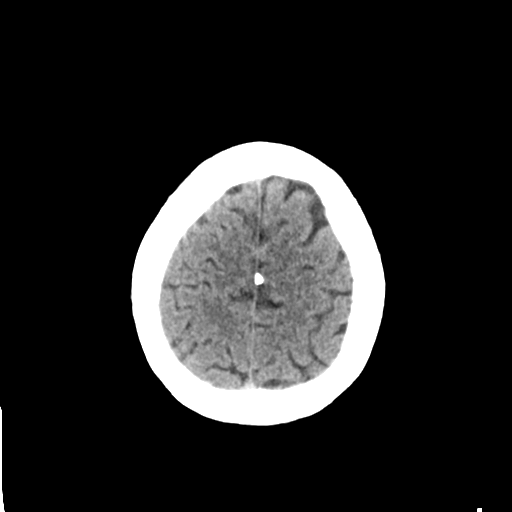
[im 25/30  brain]
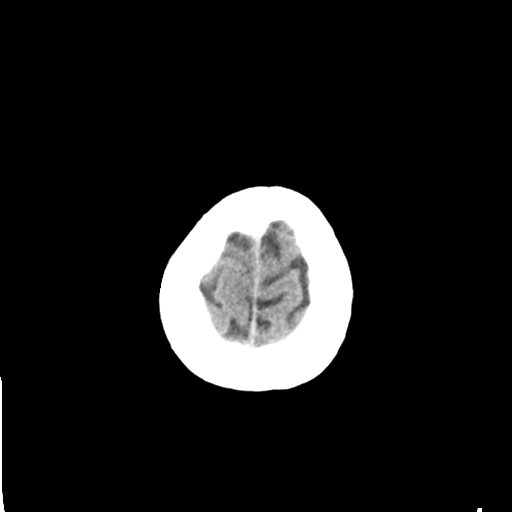
[im 28/30  brain]
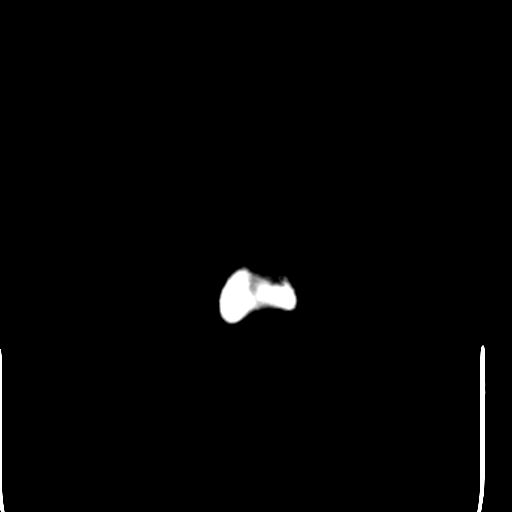
[im 28/30  bone]
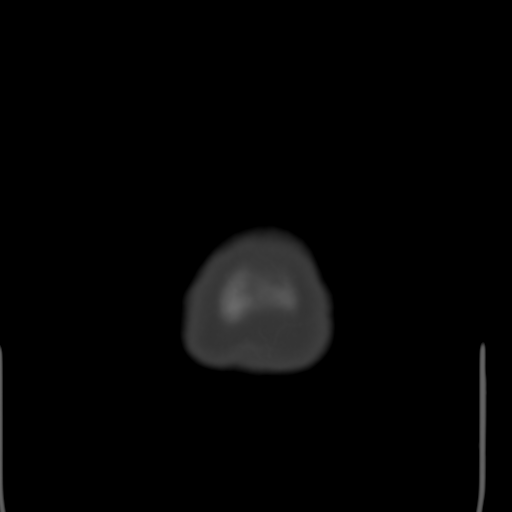

[Series 4: coronal soft tissue · coronal · 0.27mm/px · 3 of 62 slices shown]
[im 21/62  brain]
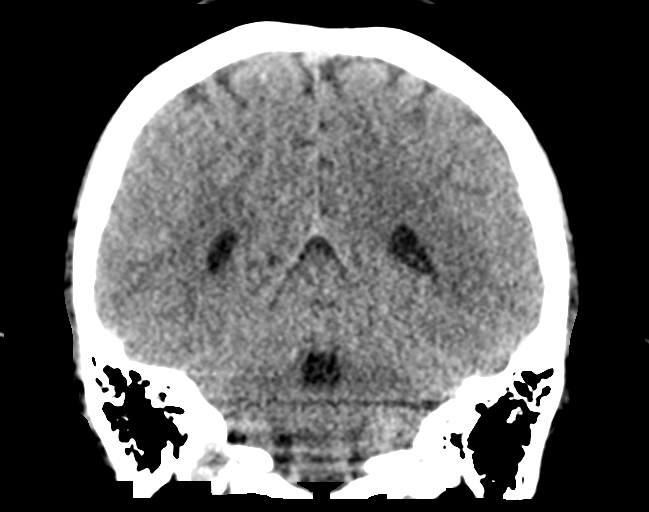
[im 28/62  brain]
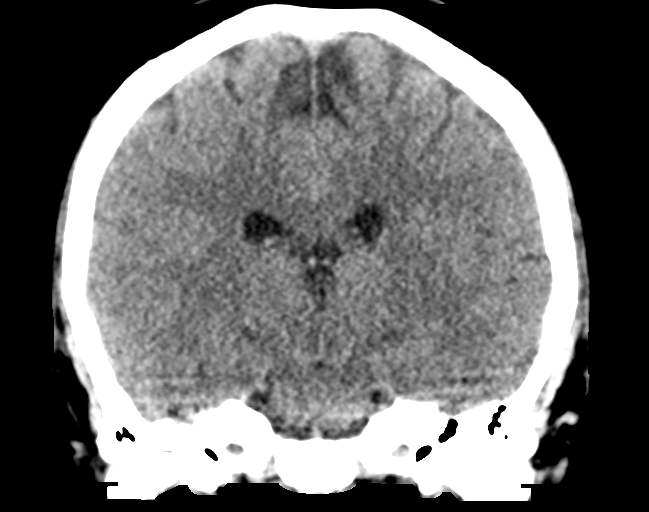
[im 34/62  brain]
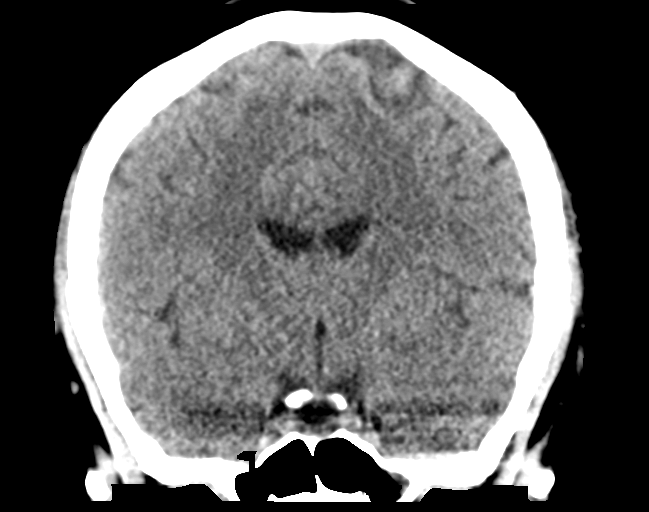

[Series 5: sagittal soft tissue · sagittal · 0.27mm/px · 3 of 50 slices shown]
[im 17/50  brain]
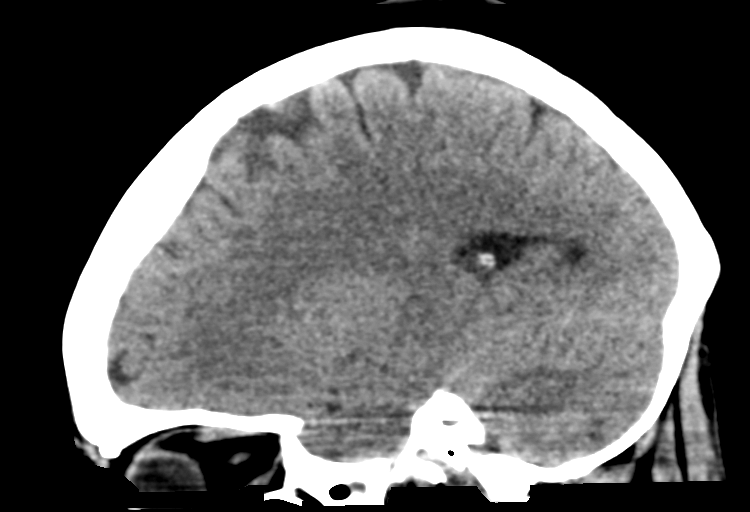
[im 25/50  brain]
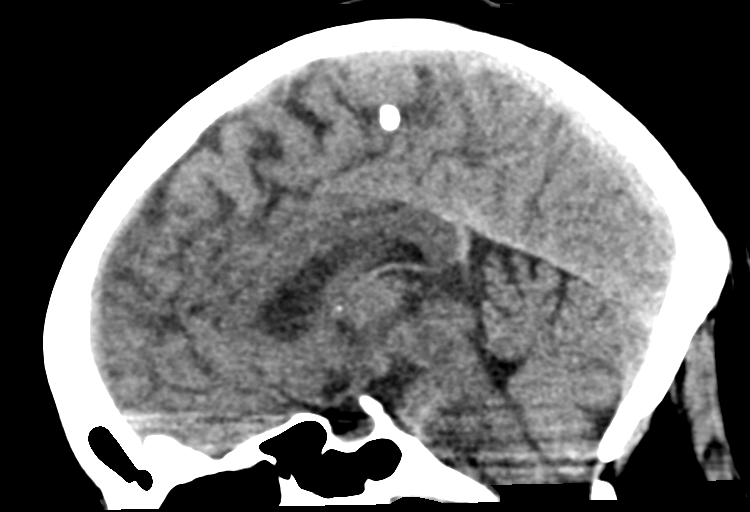
[im 33/50  brain]
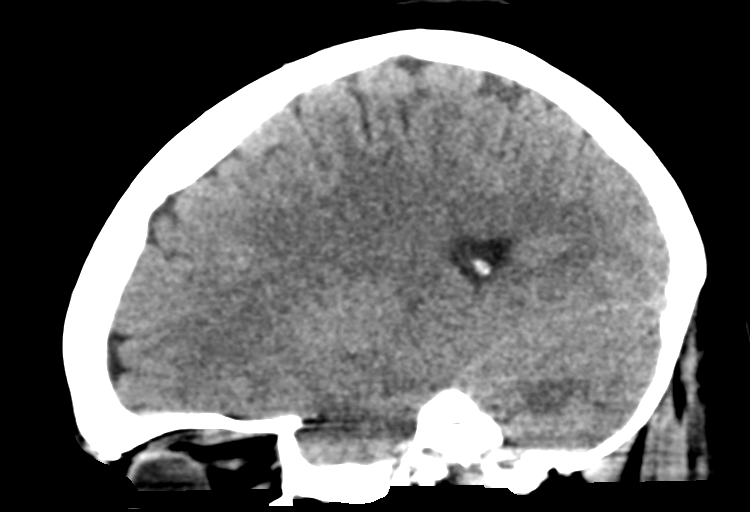

[15 of 47 positions shown; findings below may reference images not displayed]

FINDINGS: Brain: No evidence of acute infarction, hemorrhage, hydrocephalus,
extra-axial collection or mass lesion/mass effect.

Vascular: No hyperdense vessel or unexpected calcification.

Skull: Normal. Negative for fracture or focal lesion.

Sinuses/Orbits: No acute finding.

Other: None.
IMPRESSION: No acute intracranial abnormality noted.

## 2021-05-02 ENCOUNTER — Other Ambulatory Visit: Payer: Self-pay

## 2021-05-02 ENCOUNTER — Emergency Department
Admission: EM | Admit: 2021-05-02 | Discharge: 2021-05-02 | Disposition: A | Discharge status: Home / Self Care | Payer: Self-pay | Attending: Emergency Medicine | Admitting: Emergency Medicine

## 2021-05-02 DIAGNOSIS — F319 Bipolar disorder, unspecified: Secondary | ICD-10-CM | POA: Insufficient documentation

## 2021-05-02 DIAGNOSIS — S01511A Laceration without foreign body of lip, initial encounter: Secondary | ICD-10-CM | POA: Insufficient documentation

## 2021-05-02 DIAGNOSIS — E119 Type 2 diabetes mellitus without complications: Secondary | ICD-10-CM | POA: Insufficient documentation

## 2021-05-02 DIAGNOSIS — Z79899 Other long term (current) drug therapy: Secondary | ICD-10-CM | POA: Insufficient documentation

## 2021-05-02 DIAGNOSIS — F17210 Nicotine dependence, cigarettes, uncomplicated: Secondary | ICD-10-CM | POA: Insufficient documentation

## 2021-05-02 LAB — CBG MONITORING, ED: Glucose-Capillary: 105 mg/dL — ABNORMAL HIGH (ref 70–99)

## 2021-05-02 MED ORDER — LIDOCAINE HCL (PF) 1 % IJ SOLN
10.0000 mL | Freq: Once | INTRAMUSCULAR | Status: AC
  Administered 2021-05-02: 10 mL
  Filled 2021-05-02: qty 10

## 2021-05-02 MED ORDER — KETOROLAC TROMETHAMINE 30 MG/ML IJ SOLN
30.0000 mg | Freq: Once | INTRAMUSCULAR | Status: AC
  Administered 2021-05-02: 30 mg via INTRAMUSCULAR
  Filled 2021-05-02: qty 1
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
## 2021-05-02 NOTE — ED Notes (Signed)
SANE nurse at pt bedside at this time.
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
## 2021-05-02 NOTE — SANE Note (Signed)
SANE/FNE RN will be in to see the patient in approximately 45 minutes.

## 2021-05-02 NOTE — SANE Note (Signed)
Follow-up Phone Call  Patient gives verbal consent for a FNE/SANE follow-up phone call in 48-72 hours: DID NOT ASK THE PT. Patient's telephone number: [phone] (PT'S CELL W/ VM & TEXTING). Patient gives verbal consent to leave voicemail at the phone number listed above: DID NOT ASK THE PT. DO NOT CALL between the hours of: N/A   PT'S EMAIL ADDRESS:  [email or web address]  [place] POLICE DEPARTMENT CASE NUMBER:  [id number] OFFICER K. LITTLE

## 2021-05-02 NOTE — SANE Note (Signed)
On 05/02/2021, at approximately 1351 hours, the SANE/FNE Teacher, music) consult was completed. The primary RN and provider have been notified. Please contact the SANE/FNE nurse on call (listed in Amion) with any further concerns.

## 2021-07-27 ENCOUNTER — Other Ambulatory Visit: Payer: Self-pay

## 2021-07-27 ENCOUNTER — Emergency Department
Admission: EM | Admit: 2021-07-27 | Discharge: 2021-07-27 | Disposition: A | Discharge status: Home / Self Care | Payer: Self-pay | Attending: Emergency Medicine | Admitting: Emergency Medicine

## 2021-07-27 DIAGNOSIS — E119 Type 2 diabetes mellitus without complications: Secondary | ICD-10-CM | POA: Insufficient documentation

## 2021-07-27 DIAGNOSIS — Z3202 Encounter for pregnancy test, result negative: Secondary | ICD-10-CM | POA: Insufficient documentation

## 2021-07-27 DIAGNOSIS — R4586 Emotional lability: Secondary | ICD-10-CM | POA: Insufficient documentation

## 2021-07-27 DIAGNOSIS — R4589 Other symptoms and signs involving emotional state: Secondary | ICD-10-CM

## 2021-07-27 LAB — URINE DRUG SCREEN, QUALITATIVE (ARMC ONLY)
Amphetamines, Ur Screen: NOT DETECTED
Barbiturates, Ur Screen: NOT DETECTED
Benzodiazepine, Ur Scrn: NOT DETECTED
Cannabinoid 50 Ng, Ur ~~LOC~~: NOT DETECTED
Cocaine Metabolite,Ur ~~LOC~~: NOT DETECTED
MDMA (Ecstasy)Ur Screen: NOT DETECTED
Methadone Scn, Ur: NOT DETECTED
Opiate, Ur Screen: NOT DETECTED
Phencyclidine (PCP) Ur S: NOT DETECTED
Tricyclic, Ur Screen: NOT DETECTED

## 2021-07-27 LAB — CBC
HCT: 40.6 % (ref 36.0–46.0)
Hemoglobin: 14.3 g/dL (ref 12.0–15.0)
MCH: 34.3 pg — ABNORMAL HIGH (ref 26.0–34.0)
MCHC: 35.2 g/dL (ref 30.0–36.0)
MCV: 97.4 fL (ref 80.0–100.0)
Platelets: 356 10*3/uL (ref 150–400)
RBC: 4.17 MIL/uL (ref 3.87–5.11)
RDW: 12.5 % (ref 11.5–15.5)
WBC: 10.1 10*3/uL (ref 4.0–10.5)
nRBC: 0 % (ref 0.0–0.2)

## 2021-07-27 LAB — COMPREHENSIVE METABOLIC PANEL
ALT: 16 U/L (ref 0–44)
AST: 15 U/L (ref 15–41)
Albumin: 4 g/dL (ref 3.5–5.0)
Alkaline Phosphatase: 97 U/L (ref 38–126)
Anion gap: 11 (ref 5–15)
BUN: 10 mg/dL (ref 6–20)
CO2: 20 mmol/L — ABNORMAL LOW (ref 22–32)
Calcium: 8.7 mg/dL — ABNORMAL LOW (ref 8.9–10.3)
Chloride: 105 mmol/L (ref 98–111)
Creatinine, Ser: 0.89 mg/dL (ref 0.44–1.00)
GFR, Estimated: >60 mL/min (ref >60)
Glucose, Bld: 117 mg/dL — ABNORMAL HIGH (ref 70–99)
Potassium: 3.5 mmol/L (ref 3.5–5.1)
Sodium: 136 mmol/L (ref 135–145)
Total Bilirubin: 0.5 mg/dL (ref 0.3–1.2)
Total Protein: 7.1 g/dL (ref 6.5–8.1)

## 2021-07-27 LAB — POC URINE PREG, ED: Preg Test, Ur: NEGATIVE

## 2021-07-27 LAB — ACETAMINOPHEN LEVEL: Acetaminophen (Tylenol), Serum: <10 ug/mL — ABNORMAL LOW (ref 10–30)

## 2021-07-27 LAB — ETHANOL: Alcohol, Ethyl (B): 120 mg/dL — ABNORMAL HIGH (ref <10)

## 2021-07-27 LAB — SALICYLATE LEVEL: Salicylate Lvl: <7 mg/dL — ABNORMAL LOW (ref 7.0–30.0)
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
## 2021-07-27 NOTE — Discharge Instructions (Signed)
Continue your medications as prescribed.  Return to the ED as needed for any further worsening of your depression or thoughts of harming herself.
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
## 2021-07-27 NOTE — ED Notes (Signed)
Smith MD at bedside ?
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
